# Patient Record
Sex: Female | Born: 1985 | Race: White | Hispanic: No | Marital: Single | Smoking: Current every day smoker
Health system: Southern US, Community
[De-identification: ages and names within clinical notes are randomized; demographics above are authoritative.]

## PROBLEM LIST (undated history)

## (undated) ENCOUNTER — Inpatient Hospital Stay (HOSPITAL_COMMUNITY): Payer: Self-pay

## (undated) DIAGNOSIS — M25311 Other instability, right shoulder: Secondary | ICD-10-CM

## (undated) DIAGNOSIS — J302 Other seasonal allergic rhinitis: Secondary | ICD-10-CM

## (undated) DIAGNOSIS — Z0000 Encounter for general adult medical examination without abnormal findings: Secondary | ICD-10-CM

## (undated) DIAGNOSIS — IMO0002 Reserved for concepts with insufficient information to code with codable children: Secondary | ICD-10-CM

## (undated) DIAGNOSIS — F419 Anxiety disorder, unspecified: Secondary | ICD-10-CM

## (undated) DIAGNOSIS — R51 Headache: Secondary | ICD-10-CM

## (undated) DIAGNOSIS — F410 Panic disorder [episodic paroxysmal anxiety] without agoraphobia: Secondary | ICD-10-CM

## (undated) HISTORY — PX: WISDOM TOOTH EXTRACTION: SHX21

## (undated) HISTORY — DX: Encounter for general adult medical examination without abnormal findings: Z00.00

---
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
## 2006-06-16 ENCOUNTER — Inpatient Hospital Stay (HOSPITAL_COMMUNITY): Admission: AD | Admit: 2006-06-16 | Discharge: 2006-06-17 | Payer: Self-pay | Admitting: Obstetrics & Gynecology

## 2006-06-30 ENCOUNTER — Encounter (INDEPENDENT_AMBULATORY_CARE_PROVIDER_SITE_OTHER): Payer: Self-pay | Admitting: Obstetrics & Gynecology

## 2006-06-30 ENCOUNTER — Ambulatory Visit: Payer: Self-pay | Admitting: Obstetrics & Gynecology

## 2006-08-10 ENCOUNTER — Ambulatory Visit: Payer: Self-pay | Admitting: Obstetrics & Gynecology

## 2006-08-24 ENCOUNTER — Ambulatory Visit: Payer: Self-pay | Admitting: Obstetrics & Gynecology

## 2009-03-25 ENCOUNTER — Emergency Department (HOSPITAL_COMMUNITY): Admission: EM | Admit: 2009-03-25 | Discharge: 2009-03-25 | Payer: Self-pay | Admitting: Emergency Medicine

## 2009-04-10 ENCOUNTER — Encounter: Admission: RE | Admit: 2009-04-10 | Discharge: 2009-04-10 | Payer: Self-pay | Admitting: Specialist

## 2009-08-26 ENCOUNTER — Encounter: Admission: RE | Admit: 2009-08-26 | Discharge: 2009-08-26 | Payer: Self-pay | Admitting: Nephrology

## 2010-10-13 ENCOUNTER — Inpatient Hospital Stay (HOSPITAL_COMMUNITY): Admission: AD | Admit: 2010-10-13 | Discharge: 2010-10-13 | Payer: Self-pay | Admitting: Obstetrics & Gynecology

## 2010-10-17 ENCOUNTER — Ambulatory Visit: Payer: Self-pay | Admitting: Interventional Radiology

## 2010-10-17 ENCOUNTER — Emergency Department (HOSPITAL_BASED_OUTPATIENT_CLINIC_OR_DEPARTMENT_OTHER): Admission: EM | Admit: 2010-10-17 | Discharge: 2010-10-17 | Payer: Self-pay | Admitting: Emergency Medicine

## 2010-10-28 ENCOUNTER — Emergency Department (HOSPITAL_COMMUNITY)
Admission: EM | Admit: 2010-10-28 | Discharge: 2010-10-29 | Disposition: A | Discharge status: Other Acute Inpatient Hospital | Payer: Self-pay | Source: Home / Self Care | Admitting: Emergency Medicine

## 2010-10-29 ENCOUNTER — Ambulatory Visit: Payer: Self-pay | Admitting: Psychiatry

## 2010-10-29 ENCOUNTER — Inpatient Hospital Stay (HOSPITAL_COMMUNITY)
Admission: RE | Admit: 2010-10-29 | Discharge: 2010-11-03 | Payer: Self-pay | Source: Home / Self Care | Admitting: Psychiatry

## 2011-02-09 LAB — CBC
Hemoglobin: 14.5 g/dL (ref 12.0–15.0)
MCH: 29.4 pg (ref 26.0–34.0)
MCV: 86.8 fL (ref 78.0–100.0)
RBC: 4.93 MIL/uL (ref 3.87–5.11)

## 2011-02-09 LAB — POCT PREGNANCY, URINE
Preg Test, Ur: NEGATIVE
Preg Test, Ur: NEGATIVE

## 2011-02-09 LAB — URINALYSIS, ROUTINE W REFLEX MICROSCOPIC
Glucose, UA: NEGATIVE mg/dL
Hgb urine dipstick: NEGATIVE
Ketones, ur: NEGATIVE mg/dL
Ketones, ur: NEGATIVE mg/dL
Leukocytes, UA: NEGATIVE
Protein, ur: NEGATIVE mg/dL
Urobilinogen, UA: 0.2 mg/dL (ref 0.0–1.0)
pH: 7 (ref 5.0–8.0)

## 2011-02-09 LAB — DIFFERENTIAL
Eosinophils Absolute: 0.2 10*3/uL (ref 0.0–0.7)
Eosinophils Relative: 2 % (ref 0–5)
Lymphs Abs: 2.7 10*3/uL (ref 0.7–4.0)
Monocytes Relative: 9 % (ref 3–12)
Neutrophils Relative %: 67 % (ref 43–77)

## 2011-02-09 LAB — HEPATIC FUNCTION PANEL
AST: 15 U/L (ref 0–37)
Albumin: 3.5 g/dL (ref 3.5–5.2)
Total Bilirubin: 0.3 mg/dL (ref 0.3–1.2)

## 2011-02-09 LAB — BASIC METABOLIC PANEL
CO2: 25 mEq/L (ref 19–32)
Chloride: 107 mEq/L (ref 96–112)
GFR calc Af Amer: >60 mL/min (ref >60)
Potassium: 3.7 mEq/L (ref 3.5–5.1)
Sodium: 139 mEq/L (ref 135–145)

## 2011-02-09 LAB — URINE MICROSCOPIC-ADD ON

## 2011-02-09 LAB — RAPID URINE DRUG SCREEN, HOSP PERFORMED
Amphetamines: NOT DETECTED
Benzodiazepines: POSITIVE — AB
Opiates: POSITIVE — AB
Tetrahydrocannabinol: NOT DETECTED

## 2011-02-09 LAB — WET PREP, GENITAL: Yeast Wet Prep HPF POC: NONE SEEN

## 2011-04-16 NOTE — Group Therapy Note (Signed)
[name], [name]             ACCOUNT NO.:  [id number]   MEDICAL RECORD NO.:  [id number]          PATIENT TYPE:  WOC   LOCATION:  [facility]                   FACILITY:  [facility]   PHYSICIAN:  Dorthula Perfect, MD     DATE OF BIRTH:  21-Oct-1986   DATE OF SERVICE:  08/24/2006                                    CLINIC NOTE   25 year old gravida 2, para 2 returns for follow up of condyloma and to  receive TCA.  She has been using Depo-Provera since the age of 36 for  contraception.  Her menstrual periods are irregular.  She has irregular  bleeding and spotting.  She was seen here 2 weeks ago for the second  treatment with TCA.  Her pregnancy test, at her request, is negative.   PHYSICAL EXAMINATION:  ABDOMEN:  Soft and nontender.  No masses are felt.  PELVIC:  Her bimanual examination is completely normal, with the exception  of kissing very small condyloma on the lower labial minora near the  fourchette area.  The remainder of the pelvic exam is normal.  The ovaries  are normal.   IMPRESSION:  1. Normal gynecological exam.  2. Condyloma, persistent.   DISPOSITION:  1. TCA 80% with cotton tip applicator.  2. She will return after waiting 2 to 4 weeks to see if this TCA finally      gets rid of the condyloma.  3. The patient is wondering about stopping the Depo-Provera.  I gave her      the pros and cons of low-dose birth control pills.  She is due for      another Depo-Provera in the later part of November.  She will decide by      then whether to continue the Depo-Provera or switch to birth control      pills.           ______________________________  Dorthula Perfect, MD     ER/MEDQ  D:  08/24/2006  T:  08/26/2006  Job:  [id number]

## 2011-04-16 NOTE — Group Therapy Note (Signed)
[name], [name]             ACCOUNT NO.:  [id number]   MEDICAL RECORD NO.:  [id number]          PATIENT TYPE:  WOC   LOCATION:  [facility]                   FACILITY:  [facility]   PHYSICIAN:  Dorthula Perfect, MD     DATE OF BIRTH:  05-19-1986   DATE OF SERVICE:                                    CLINIC NOTE   HISTORY OF PRESENT ILLNESS:  This 25 year old white female, gravida 2,  aborted 2, last menstrual period 05/18/2006 presents today for treatment of  her condyloma.  For the past two to three years, she has received Depo  Provera every three months for birth control and as a result, she has  periods every couple of months.  Her periods are normal.  She was seen in  the MAU a few weeks ago for evaluation of a vaginal discharge and was  diagnosed with bacterial vaginosis.  It was at that time she was told that  she had condyloma.   PAST MEDICAL HISTORY:  No operations.   MEDICATIONS:  No medications other than the Depo Provera.   ALLERGIES:  No allergies.   REVIEW OF SYSTEMS:  Normal.  Good health.  No cardiovascular, respiratory,  GI, or GU complaints.   PHYSICAL EXAMINATION:  The abdomen is soft and nontender.  No masses are  felt.  On pelvic examination, external genitalia BS glands are normal.  Vaginal vault was epithelialized, as was the cervix.  The uterus was in the  midline and normal size and shape.  The adnexal structures are normal.   The patient has a small condyloma kissing the lower labia bilaterally.   DIAGNOSES:  1.  Normal GYN exam.  2.  Condyloma.   DISPOSITION:  1.  Pap smear.  2.  Condyloma treated with 80% TCA.  The patient was instructed to take a      sitz bath rather than shower this evening.  She was told that if these      are still present a month from now, that she should return.           ______________________________  Dorthula Perfect, MD     ER/MEDQ  D:  06/30/2006  T:  06/30/2006  Job:  [id number]

## 2011-04-16 NOTE — Group Therapy Note (Signed)
[name], [name]             ACCOUNT NO.:  [id number]   MEDICAL RECORD NO.:  [id number]          PATIENT TYPE:  WOC   LOCATION:  [facility]                   FACILITY:  [facility]   PHYSICIAN:  Elsie Lincoln, MD      DATE OF BIRTH:  1986/07/25   DATE OF SERVICE:  08/10/2006                                    CLINIC NOTE   A 25 year old G2, A2, nonpregnant, who presents with a history of condyloma  labial bilaterally.  She was treated with TCA 80% on August 2.  A Pap on  that date was normal.  She was also treated for BV on August 2 with Flagyl.  She complains now of returning symptoms of both condyloma and yellowish  discharge x1 week with pain and itching.  Last menstrual period was June 20.  She has some occasional spotting but not regular periods and she is not  sexually active.   PHYSICAL EXAMINATION:  Revealed bilateral kissing condyloma on lower labia  minora.  No discharge. Wet prep and GC ECT were collected.  Occasional clue  cells were seen on the wet prep slide.   PLAN:  Retreat with TCA 80% on the affected areas, which was done with a  cotton-tipped applicator.  Clindesse cream was given for BV and the patient  was instructed to return to the clinic in 2 weeks for another treatment with  TCA.           ______________________________  Elsie Lincoln, MD     KL/MEDQ  D:  08/10/2006  T:  08/11/2006  Job:  [id number]

## 2011-10-29 ENCOUNTER — Inpatient Hospital Stay (HOSPITAL_COMMUNITY)
Admission: AD | Admit: 2011-10-29 | Discharge: 2011-10-29 | Disposition: A | Discharge status: Home / Self Care | Payer: Medicaid Other | Source: Ambulatory Visit | Attending: Family Medicine | Admitting: Family Medicine

## 2011-10-29 ENCOUNTER — Encounter (HOSPITAL_COMMUNITY): Payer: Self-pay

## 2011-10-29 DIAGNOSIS — R300 Dysuria: Secondary | ICD-10-CM | POA: Insufficient documentation

## 2011-10-29 DIAGNOSIS — O99891 Other specified diseases and conditions complicating pregnancy: Secondary | ICD-10-CM | POA: Insufficient documentation

## 2011-10-29 HISTORY — DX: Anxiety disorder, unspecified: F41.9

## 2011-10-29 LAB — WET PREP, GENITAL
Clue Cells Wet Prep HPF POC: NONE SEEN
Trich, Wet Prep: NONE SEEN
Yeast Wet Prep HPF POC: NONE SEEN

## 2011-10-29 LAB — URINALYSIS, ROUTINE W REFLEX MICROSCOPIC
Bilirubin Urine: NEGATIVE
Glucose, UA: NEGATIVE mg/dL
Ketones, ur: NEGATIVE mg/dL
Nitrite: NEGATIVE
pH: 7 (ref 5.0–8.0)

## 2011-10-29 LAB — URINE MICROSCOPIC-ADD ON

## 2011-10-29 MED ORDER — NITROFURANTOIN MONOHYD MACRO 100 MG PO CAPS: 100.0000 mg | ORAL_CAPSULE | Freq: Two times a day (BID) | ORAL | Status: AC

## 2011-10-29 NOTE — ED Provider Notes (Signed)
History     Chief Complaint  Patient presents with  . Urinary Tract Infection   HPI  Pt states noted burning with voiding last pm, hx kidney stones, and uti's in past. Denies bleeding or vaginal d/c changes. No reports of pelvic pain, fever, body aches, or chills.     Past Medical History  Diagnosis Date  . Depression   . Anxiety     Past Surgical History  Procedure Date  . Wisdom tooth extraction     History reviewed. No pertinent family history.  History  Substance Use Topics  . Smoking status: Former Games developer  . Smokeless tobacco: Never Used  . Alcohol Use: No    Allergies: No Known Allergies  Prescriptions prior to admission  Medication Sig Dispense Refill  . ALPRAZolam (XANAX) 1 MG tablet Take 1 mg by mouth 2 (two) times daily.        . cyclobenzaprine (FLEXERIL) 10 MG tablet Take 10 mg by mouth 3 (three) times daily as needed. Muscle spasms       . oxyCODONE-acetaminophen (PERCOCET) 5-325 MG per tablet Take 1 tablet by mouth every 6 (six) hours as needed. Pain        . prenatal vitamin w/FE, FA (PRENATAL 1 + 1) 27-1 MG TABS Take 1 tablet by mouth daily.          Review of Systems  Gastrointestinal: Negative for abdominal pain.  Genitourinary: Positive for dysuria and frequency. Negative for hematuria and flank pain.  Musculoskeletal: Back pain: lower.  All other systems reviewed and are negative.   Physical Exam   Blood pressure 121/74, pulse 104, temperature 98.5 F (36.9 C), temperature source Oral, resp. rate 16, height 5\' 4"  (1.626 m), weight 68.266 kg (150 lb 8 oz), last menstrual period 09/17/2011.  Physical Exam  Constitutional: She is oriented to person, place, and time. She appears well-developed and well-nourished. No distress.  HENT:  Head: Normocephalic.  Neck: Normal range of motion. Neck supple.  Cardiovascular: Normal rate, regular rhythm and normal heart sounds.   Respiratory: Effort normal and breath sounds normal.  GI: Soft. She  exhibits no mass. There is no tenderness. There is no rebound, no guarding and no CVA tenderness.  Genitourinary: Uterus is enlarged. Cervix exhibits no motion tenderness and no discharge. Right adnexum displays no mass and no tenderness. Left adnexum displays no mass and no tenderness.  Musculoskeletal: Normal range of motion.  Neurological: She is alert and oriented to person, place, and time.  Skin: Skin is warm and dry.  Psychiatric: She has a normal mood and affect.    MAU Course  Procedures  UA - neg leuk, trace hgb Wet prep - negative GC/CT - pend  Assessment and Plan  Dysuria  Plan: DC to home RX Macrobid 100 mg BID Begin prenatal care asap  [facility] 10/29/2011, 1:38 PM

## 2011-10-29 NOTE — ED Provider Notes (Signed)
Chart reviewed and agree with management and plan.  

## 2011-10-29 NOTE — Progress Notes (Signed)
Pt states noted burning with voiding last pm, hx kidney stones, and uti's in past. Denies bleeding or vaginal d/c changes. No pnc thus far.

## 2011-11-11 ENCOUNTER — Ambulatory Visit: Payer: BC Managed Care – PPO | Admitting: Gynecology

## 2011-11-11 ENCOUNTER — Encounter: Payer: Self-pay | Admitting: Gynecology

## 2011-11-11 DIAGNOSIS — Z3400 Encounter for supervision of normal first pregnancy, unspecified trimester: Secondary | ICD-10-CM

## 2011-11-11 DIAGNOSIS — O3680X0 Pregnancy with inconclusive fetal viability, not applicable or unspecified: Secondary | ICD-10-CM

## 2011-11-12 ENCOUNTER — Ambulatory Visit (HOSPITAL_COMMUNITY)
Admission: RE | Admit: 2011-11-12 | Discharge: 2011-11-12 | Disposition: A | Discharge status: Home / Self Care | Payer: Medicaid Other | Source: Ambulatory Visit | Attending: Family Medicine | Admitting: Family Medicine

## 2011-11-12 DIAGNOSIS — O3680X0 Pregnancy with inconclusive fetal viability, not applicable or unspecified: Secondary | ICD-10-CM

## 2011-11-12 DIAGNOSIS — Z3689 Encounter for other specified antenatal screening: Secondary | ICD-10-CM | POA: Insufficient documentation

## 2011-11-12 LAB — OBSTETRIC PANEL
Antibody Screen: NEGATIVE
Basophils Absolute: 0.1 10*3/uL (ref 0.0–0.1)
Eosinophils Absolute: 0.2 10*3/uL (ref 0.0–0.7)
Eosinophils Relative: 2 % (ref 0–5)
HCT: 41.9 % (ref 36.0–46.0)
MCH: 28.9 pg (ref 26.0–34.0)
MCHC: 32.9 g/dL (ref 30.0–36.0)
MCV: 87.8 fL (ref 78.0–100.0)
Monocytes Absolute: 0.7 10*3/uL (ref 0.1–1.0)
Platelets: 341 10*3/uL (ref 150–400)
RDW: 13.5 % (ref 11.5–15.5)

## 2011-11-12 LAB — HIV ANTIBODY (ROUTINE TESTING W REFLEX): HIV: NONREACTIVE

## 2011-11-12 LAB — HEPATITIS C ANTIBODY: HCV Ab: NEGATIVE

## 2011-11-13 LAB — CULTURE, URINE COMPREHENSIVE: Organism ID, Bacteria: NO GROWTH

## 2011-11-30 ENCOUNTER — Encounter (HOSPITAL_COMMUNITY): Payer: Self-pay | Admitting: *Deleted

## 2011-11-30 ENCOUNTER — Inpatient Hospital Stay (HOSPITAL_COMMUNITY)
Admission: AD | Admit: 2011-11-30 | Discharge: 2011-11-30 | Disposition: A | Discharge status: Home / Self Care | Payer: Medicaid Other | Source: Ambulatory Visit | Attending: Obstetrics & Gynecology | Admitting: Obstetrics & Gynecology

## 2011-11-30 DIAGNOSIS — O209 Hemorrhage in early pregnancy, unspecified: Secondary | ICD-10-CM | POA: Insufficient documentation

## 2011-11-30 LAB — URINALYSIS, ROUTINE W REFLEX MICROSCOPIC
Bilirubin Urine: NEGATIVE
Glucose, UA: NEGATIVE mg/dL
Ketones, ur: 15 mg/dL — AB
Leukocytes, UA: NEGATIVE
Protein, ur: NEGATIVE mg/dL
pH: 6 (ref 5.0–8.0)

## 2011-11-30 LAB — RAPID URINE DRUG SCREEN, HOSP PERFORMED
Barbiturates: NOT DETECTED
Benzodiazepines: POSITIVE — AB

## 2011-11-30 MED ORDER — RHO D IMMUNE GLOBULIN 1500 UNIT/2ML IJ SOLN
300.0000 ug | Freq: Once | INTRAMUSCULAR | Status: AC
  Administered 2011-11-30: 300 ug via INTRAMUSCULAR
  Filled 2011-11-30: qty 2

## 2011-11-30 NOTE — Progress Notes (Signed)
Pt presents to [facility] with c/o spotting that started yesterday.  Not wearing a pad.  Has been worse today than yesterday.

## 2011-11-30 NOTE — L&D Delivery Note (Signed)
Delivery Note At 9:01 PM a viable and healthy female was delivered via  (Presentation: LOA ).  APGAR: 2 ,7 ; weight pending.   Placenta status:intact,spontaneous .  Cord: 3 vessel with the following complications: nuchal x1 loose.  Cord clamped and cut at delivery and infant to warmer. Cord pH: 7.332.  Anesthesia:  epidural Episiotomy: None Lacerations: None Suture Repair: N/A Est. Blood Loss (mL): 250 ml  Mom to postpartum.  Baby to nursery-stable.  LEFTWICH-KIRBY, [name] 07/01/2012, 9:23 PM

## 2011-11-30 NOTE — Progress Notes (Signed)
Patient states she started having abdominal cramping( off and on) and brown spotting (of and on) last night. Slight cramping at this time.

## 2011-11-30 NOTE — ED Provider Notes (Signed)
[name] is a 26 y.o. female @ [redacted]w[redacted]d gestation who presents to MAU for spotting that she first noticed yesterday. Last sexual intercourse was 3 weeks ago. Is concerned because she has had a previous miscarriage. Was evaluated in MAU 10/29/11 for vaginal discharge in pregnancy and treated for BV. She started her prenatal care at [facility] and had an ultrasound on 11/12/11 that showed an 8 week viable IUP with a normal appearance. On exam today there is scant brown vaginal discharge. Cervix is long and closed.   Informal bedside ultrasound shows IUP with cardiac activity. Patient able to visualize heart beat.   Previous lab shows that patient is Rh negative. Will do Rh workup and Rhogam as indicated.  Discussed findings with Dr. Marice Potter. Since the patient has bleeding and a history of cocaine abuse we will order UDS and if positive will discuss affects of cocaine on pregnancy.   Patient has appointment in the office 12/02/11 she will plan to keep the appointment for follow up.  Assessment:  Bleeding in pregnancy  Plan:   Keep follow up office appointment    Care turned over to [facility]    UDS and Rh workup pending   Kerrie Buffalo, NP 11/30/11 2000  I accepted care of this pt from [facility], FNP. Pt has now received rhophylac injection. Drug screen is still pending as it is a "send out." Results of test will be discussed at her next appt in the office on 12/02/11.  Discussed diet, activity, risks, and precautions. Pt is to avoid intercourse for 1wk.  Clinton Gallant. Naysha Sholl III, DrHSc, MPAS, PA-C   Henrietta Hoover, [place] 11/30/11 2108

## 2011-11-30 NOTE — Progress Notes (Signed)
Mayer Camel, NP at bedside.  Assessment done and poc discussed with pt.  Bedside US done per NP.

## 2011-12-01 LAB — RH IG WORKUP (INCLUDES ABO/RH)
Antibody Screen: NEGATIVE
Unit division: 0

## 2011-12-02 ENCOUNTER — Other Ambulatory Visit (HOSPITAL_COMMUNITY)
Admission: RE | Admit: 2011-12-02 | Discharge: 2011-12-02 | Disposition: A | Discharge status: Home / Self Care | Payer: Medicaid Other | Source: Ambulatory Visit | Attending: Obstetrics & Gynecology | Admitting: Obstetrics & Gynecology

## 2011-12-02 ENCOUNTER — Ambulatory Visit (INDEPENDENT_AMBULATORY_CARE_PROVIDER_SITE_OTHER): Payer: Medicaid Other | Admitting: Obstetrics & Gynecology

## 2011-12-02 ENCOUNTER — Encounter: Payer: Self-pay | Admitting: Obstetrics & Gynecology

## 2011-12-02 VITALS — BP 119/76 | Wt 158.0 lb

## 2011-12-02 DIAGNOSIS — Z113 Encounter for screening for infections with a predominantly sexual mode of transmission: Secondary | ICD-10-CM | POA: Insufficient documentation

## 2011-12-02 DIAGNOSIS — Z01419 Encounter for gynecological examination (general) (routine) without abnormal findings: Secondary | ICD-10-CM | POA: Insufficient documentation

## 2011-12-02 DIAGNOSIS — Z3400 Encounter for supervision of normal first pregnancy, unspecified trimester: Secondary | ICD-10-CM

## 2011-12-02 NOTE — Progress Notes (Signed)
Addended by: Barbara Cower on: 12/02/2011 03:04 PM   Modules accepted: Orders

## 2011-12-02 NOTE — Progress Notes (Signed)
She is here for a NOB visit. She was seen at [facility] for a small amount of vag bleeding 2 days ago. Her ultrasound was normal and she was given Rhophylac and prescribed pelvic rest for a week. Her bleeding has resolved. We discussed recommended weight gain of less than 30 pounds. She would like 1st trimester screen with MFM. I have counseled her about baby addiction to benzos if she uses them regularly. She says she only takes them when she has a panic attack/PTSD. Her entire physical exam is normal.

## 2011-12-06 ENCOUNTER — Other Ambulatory Visit: Payer: Self-pay | Admitting: Obstetrics & Gynecology

## 2011-12-06 DIAGNOSIS — Z3682 Encounter for antenatal screening for nuchal translucency: Secondary | ICD-10-CM

## 2011-12-17 ENCOUNTER — Ambulatory Visit (HOSPITAL_COMMUNITY)
Admission: RE | Admit: 2011-12-17 | Discharge: 2011-12-17 | Disposition: A | Discharge status: Home / Self Care | Payer: Medicaid Other | Source: Ambulatory Visit | Attending: Obstetrics & Gynecology | Admitting: Obstetrics & Gynecology

## 2011-12-17 ENCOUNTER — Other Ambulatory Visit: Payer: Self-pay

## 2011-12-17 DIAGNOSIS — Z3682 Encounter for antenatal screening for nuchal translucency: Secondary | ICD-10-CM

## 2011-12-17 DIAGNOSIS — O351XX0 Maternal care for (suspected) chromosomal abnormality in fetus, not applicable or unspecified: Secondary | ICD-10-CM | POA: Insufficient documentation

## 2011-12-17 DIAGNOSIS — O3510X0 Maternal care for (suspected) chromosomal abnormality in fetus, unspecified, not applicable or unspecified: Secondary | ICD-10-CM | POA: Insufficient documentation

## 2011-12-17 DIAGNOSIS — Z3689 Encounter for other specified antenatal screening: Secondary | ICD-10-CM | POA: Insufficient documentation

## 2011-12-21 ENCOUNTER — Encounter: Payer: Self-pay | Admitting: Obstetrics & Gynecology

## 2011-12-21 DIAGNOSIS — Z3490 Encounter for supervision of normal pregnancy, unspecified, unspecified trimester: Secondary | ICD-10-CM | POA: Insufficient documentation

## 2011-12-23 ENCOUNTER — Other Ambulatory Visit: Payer: Self-pay | Admitting: Obstetrics & Gynecology

## 2011-12-23 ENCOUNTER — Telehealth (HOSPITAL_COMMUNITY): Payer: Self-pay | Admitting: MS"

## 2011-12-23 DIAGNOSIS — O289 Unspecified abnormal findings on antenatal screening of mother: Secondary | ICD-10-CM

## 2011-12-23 DIAGNOSIS — Z3689 Encounter for other specified antenatal screening: Secondary | ICD-10-CM

## 2011-12-23 NOTE — Telephone Encounter (Signed)
Called Ms. Engineer, [name] to discuss First Trimester Screen result, which indicated an increased risk for trisomy 18/13 (1:68) in the current pregnancy. Additionally, this screen was within normal range for Down syndrome (less than 1 in 10,000). We reviewed the option of second trimester targeted ultrasound as well as genetic counseling to discuss additional screening and testing options available to her in the pregnancy. I offered Ms. Asaro a sooner genetic counseling visit to further discuss the First trimester screen result and additional available tests. Ms. Khamis stated that she understands that screening tests are not diagnostic and that she currently feels comfortable with a 1 in 68 risk. She elected to proceed with genetic counseling at the time of her second trimester detailed ultrasound. I encouraged Ms. Spraker to call back with additional questions or if she would like a sooner genetic counseling visit.   Quinn Plowman, MS Patent attorney

## 2011-12-29 ENCOUNTER — Ambulatory Visit (INDEPENDENT_AMBULATORY_CARE_PROVIDER_SITE_OTHER): Payer: Medicaid Other | Admitting: Family Medicine

## 2011-12-29 DIAGNOSIS — Z3480 Encounter for supervision of other normal pregnancy, unspecified trimester: Secondary | ICD-10-CM

## 2011-12-29 DIAGNOSIS — Z6791 Unspecified blood type, Rh negative: Secondary | ICD-10-CM | POA: Insufficient documentation

## 2011-12-29 DIAGNOSIS — O360990 Maternal care for other rhesus isoimmunization, unspecified trimester, not applicable or unspecified: Secondary | ICD-10-CM

## 2011-12-29 DIAGNOSIS — O26899 Other specified pregnancy related conditions, unspecified trimester: Secondary | ICD-10-CM

## 2011-12-29 DIAGNOSIS — Z3490 Encounter for supervision of normal pregnancy, unspecified, unspecified trimester: Secondary | ICD-10-CM

## 2011-12-29 NOTE — Patient Instructions (Signed)
Pregnancy - Second Trimester The second trimester of pregnancy (3 to 6 months) is a period of rapid growth for you and your baby. At the end of the sixth month, your baby is about 9 inches long and weighs 1 1/2 pounds. You will begin to feel the baby move between 18 and 20 weeks of the pregnancy. This is called quickening. Weight gain is faster. A clear fluid (colostrum) may leak out of your breasts. You may feel small contractions of the womb (uterus). This is known as false labor or Braxton-Hicks contractions. This is like a practice for labor when the baby is ready to be born. Usually, the problems with morning sickness have usually passed by the end of your first trimester. Some women develop small dark blotches (called cholasma, mask of pregnancy) on their face that usually goes away after the baby is born. Exposure to the sun makes the blotches worse. Acne may also develop in some pregnant women and pregnant women who have acne, may find that it goes away. PRENATAL EXAMS  Blood work may continue to be done during prenatal exams. These tests are done to check on your health and the probable health of your baby. Blood work is used to follow your blood levels (hemoglobin). Anemia (low hemoglobin) is common during pregnancy. Iron and vitamins are given to help prevent this. You will also be checked for diabetes between 24 and 28 weeks of the pregnancy. Some of the previous blood tests may be repeated.   The size of the uterus is measured during each visit. This is to make sure that the baby is continuing to grow properly according to the dates of the pregnancy.   Your blood pressure is checked every prenatal visit. This is to make sure you are not getting toxemia.   Your urine is checked to make sure you do not have an infection, diabetes or protein in the urine.   Your weight is checked often to make sure gains are happening at the suggested rate. This is to ensure that both you and your baby are  growing normally.   Sometimes, an ultrasound is performed to confirm the proper growth and development of the baby. This is a test which bounces harmless sound waves off the baby so your caregiver can more accurately determine due dates.  Sometimes, a specialized test is done on the amniotic fluid surrounding the baby. This test is called an amniocentesis. The amniotic fluid is obtained by sticking a needle into the belly (abdomen). This is done to check the chromosomes in instances where there is a concern about possible genetic problems with the baby. It is also sometimes done near the end of pregnancy if an early delivery is required. In this case, it is done to help make sure the baby's lungs are mature enough for the baby to live outside of the womb. CHANGES OCCURING IN THE SECOND TRIMESTER OF PREGNANCY Your body goes through many changes during pregnancy. They vary from person to person. Talk to your caregiver about changes you notice that you are concerned about.  During the second trimester, you will likely have an increase in your appetite. It is normal to have cravings for certain foods. This varies from person to person and pregnancy to pregnancy.   Your lower abdomen will begin to bulge.   You may have to urinate more often because the uterus and baby are pressing on your bladder. It is also common to get more bladder infections during pregnancy (  pain with urination). You can help this by drinking lots of fluids and emptying your bladder before and after intercourse.   You may begin to get stretch marks on your hips, abdomen, and breasts. These are normal changes in the body during pregnancy. There are no exercises or medications to take that prevent this change.   You may begin to develop swollen and bulging veins (varicose veins) in your legs. Wearing support hose, elevating your feet for 15 minutes, 3 to 4 times a day and limiting salt in your diet helps lessen the problem.    Heartburn may develop as the uterus grows and pushes up against the stomach. Antacids recommended by your caregiver helps with this problem. Also, eating smaller meals 4 to 5 times a day helps.   Constipation can be treated with a stool softener or adding bulk to your diet. Drinking lots of fluids, vegetables, fruits, and whole grains are helpful.   Exercising is also helpful. If you have been very active up until your pregnancy, most of these activities can be continued during your pregnancy. If you have been less active, it is helpful to start an exercise program such as walking.   Hemorrhoids (varicose veins in the rectum) may develop at the end of the second trimester. Warm sitz baths and hemorrhoid cream recommended by your caregiver helps hemorrhoid problems.   Backaches may develop during this time of your pregnancy. Avoid heavy lifting, wear low heal shoes and practice good posture to help with backache problems.   Some pregnant women develop tingling and numbness of their hand and fingers because of swelling and tightening of ligaments in the wrist (carpel tunnel syndrome). This goes away after the baby is born.   As your breasts enlarge, you may have to get a bigger bra. Get a comfortable, cotton, support bra. Do not get a nursing bra until the last month of the pregnancy if you will be nursing the baby.   You may get a dark line from your belly button to the pubic area called the linea nigra.   You may develop rosy cheeks because of increase blood flow to the face.   You may develop spider looking lines of the face, neck, arms and chest. These go away after the baby is born.  HOME CARE INSTRUCTIONS   It is extremely important to avoid all smoking, herbs, alcohol, and unprescribed drugs during your pregnancy. These chemicals affect the formation and growth of the baby. Avoid these chemicals throughout the pregnancy to ensure the delivery of a healthy infant.   Most of your home  care instructions are the same as suggested for the first trimester of your pregnancy. Keep your caregiver's appointments. Follow your caregiver's instructions regarding medication use, exercise and diet.   During pregnancy, you are providing food for you and your baby. Continue to eat regular, well-balanced meals. Choose foods such as meat, fish, milk and other low fat dairy products, vegetables, fruits, and whole-grain breads and cereals. Your caregiver will tell you of the ideal weight gain.   A physical sexual relationship may be continued up until near the end of pregnancy if there are no other problems. Problems could include early (premature) leaking of amniotic fluid from the membranes, vaginal bleeding, abdominal pain, or other medical or pregnancy problems.   Exercise regularly if there are no restrictions. Check with your caregiver if you are unsure of the safety of some of your exercises. The greatest weight gain will occur in the   last 2 trimesters of pregnancy. Exercise will help you:   Control your weight.   Get you in shape for labor and delivery.   Lose weight after you have the baby.   Wear a good support or jogging bra for breast tenderness during pregnancy. This may help if worn during sleep. Pads or tissues may be used in the bra if you are leaking colostrum.   Do not use hot tubs, steam rooms or saunas throughout the pregnancy.   Wear your seat belt at all times when driving. This protects you and your baby if you are in an accident.   Avoid raw meat, uncooked cheese, cat litter boxes and soil used by cats. These carry germs that can cause birth defects in the baby.   The second trimester is also a good time to visit your dentist for your dental health if this has not been done yet. Getting your teeth cleaned is OK. Use a soft toothbrush. Brush gently during pregnancy.   It is easier to loose urine during pregnancy. Tightening up and strengthening the pelvic muscles will  help with this problem. Practice stopping your urination while you are going to the bathroom. These are the same muscles you need to strengthen. It is also the muscles you would use as if you were trying to stop from passing gas. You can practice tightening these muscles up 10 times a set and repeating this about 3 times per day. Once you know what muscles to tighten up, do not perform these exercises during urination. It is more likely to contribute to an infection by backing up the urine.   Ask for help if you have financial, counseling or nutritional needs during pregnancy. Your caregiver will be able to offer counseling for these needs as well as refer you for other special needs.   Your skin may become oily. If so, wash your face with mild soap, use non-greasy moisturizer and oil or cream based makeup.  MEDICATIONS AND DRUG USE IN PREGNANCY  Take prenatal vitamins as directed. The vitamin should contain 1 milligram of folic acid. Keep all vitamins out of reach of children. Only a couple vitamins or tablets containing iron may be fatal to a baby or young child when ingested.   Avoid use of all medications, including herbs, over-the-counter medications, not prescribed or suggested by your caregiver. Only take over-the-counter or prescription medicines for pain, discomfort, or fever as directed by your caregiver. Do not use aspirin.   Let your caregiver also know about herbs you may be using.   Alcohol is related to a number of birth defects. This includes fetal alcohol syndrome. All alcohol, in any form, should be avoided completely. Smoking will cause low birth rate and premature babies.   Street or illegal drugs are very harmful to the baby. They are absolutely forbidden. A baby born to an addicted mother will be addicted at birth. The baby will go through the same withdrawal an adult does.  SEEK MEDICAL CARE IF:  You have any concerns or worries during your pregnancy. It is better to call with  your questions if you feel they cannot wait, rather than worry about them. SEEK IMMEDIATE MEDICAL CARE IF:   An unexplained oral temperature above 102 F (38.9 C) develops, or as your caregiver suggests.   You have leaking of fluid from the vagina (birth canal). If leaking membranes are suspected, take your temperature and tell your caregiver of this when you call.   There   is vaginal spotting, bleeding, or passing clots. Tell your caregiver of the amount and how many pads are used. Light spotting in pregnancy is common, especially following intercourse.   You develop a bad smelling vaginal discharge with a change in the color from clear to white.   You continue to feel sick to your stomach (nauseated) and have no relief from remedies suggested. You vomit blood or coffee ground-like materials.   You lose more than 2 pounds of weight or gain more than 2 pounds of weight over 1 week, or as suggested by your caregiver.   You notice swelling of your face, hands, feet, or legs.   You get exposed to German measles and have never had them.   You are exposed to fifth disease or chickenpox.   You develop belly (abdominal) pain. Round ligament discomfort is a common non-cancerous (benign) cause of abdominal pain in pregnancy. Your caregiver still must evaluate you.   You develop a bad headache that does not go away.   You develop fever, diarrhea, pain with urination, or shortness of breath.   You develop visual problems, blurry, or double vision.   You fall or are in a car accident or any kind of trauma.   There is mental or physical violence at home.  Document Released: 11/09/2001 Document Revised: 07/28/2011 Document Reviewed: 05/14/2009 [employer] Patient Information 2012 [employer]. Breastfeeding BENEFITS OF BREASTFEEDING For the baby  The first milk (colostrum) helps the baby's digestive system function better.   There are antibodies from the mother in the milk that help the  baby fight off infections.   The baby has a lower incidence of asthma, allergies, and SIDS (sudden infant death syndrome).   The nutrients in breast milk are better than formulas for the baby and helps the baby's brain grow better.   Babies who breastfeed have less gas, colic, and constipation.  For the mother  Breastfeeding helps develop a very special bond between mother and baby.   It is more convenient, always available at the correct temperature and cheaper than formula feeding.   It burns calories in the mother and helps with losing weight that was gained during pregnancy.   It makes the uterus contract back down to normal size faster and slows bleeding following delivery.   Breastfeeding mothers have a lower risk of developing breast cancer.  NURSE FREQUENTLY  A healthy, full-term baby may breastfeed as often as every hour or space his or her feedings to every 3 hours.   How often to nurse will vary from baby to baby. Watch your baby for signs of hunger, not the clock.   Nurse as often as the baby requests, or when you feel the need to reduce the fullness of your breasts.   Awaken the baby if it has been 3 to 4 hours since the last feeding.   Frequent feeding will help the mother make more milk and will prevent problems like sore nipples and engorgement of the breasts.  BABY'S POSITION AT THE BREAST  Whether lying down or sitting, be sure that the baby's tummy is facing your tummy.   Support the breast with 4 fingers underneath the breast and the thumb above. Make sure your fingers are well away from the nipple and baby's mouth.   Stroke the baby's lips and cheek closest to the breast gently with your finger or nipple.   When the baby's mouth is open wide enough, place all of your nipple and as much   of the dark area around the nipple as possible into your baby's mouth.   Pull the baby in close so the tip of the nose and the baby's cheeks touch the breast during the  feeding.  FEEDINGS  The length of each feeding varies from baby to baby and from feeding to feeding.   The baby must suck about 2 to 3 minutes for your milk to get to him or her. This is called a "let down." For this reason, allow the baby to feed on each breast as long as he or she wants. Your baby will end the feeding when he or she has received the right balance of nutrients.   To break the suction, put your finger into the corner of the baby's mouth and slide it between his or her gums before removing your breast from his or her mouth. This will help prevent sore nipples.  REDUCING BREAST ENGORGEMENT  In the first week after your baby is born, you may experience signs of breast engorgement. When breasts are engorged, they feel heavy, warm, full, and may be tender to the touch. You can reduce engorgement if you:   Nurse frequently, every 2 to 3 hours. Mothers who breastfeed early and often have fewer problems with engorgement.   Place light ice packs on your breasts between feedings. This reduces swelling. Wrap the ice packs in a lightweight towel to protect your skin.   Apply moist hot packs to your breast for 5 to 10 minutes before each feeding. This increases circulation and helps the milk flow.   Gently massage your breast before and during the feeding.   Make sure that the baby empties at least one breast at every feeding before switching sides.   Use a breast pump to empty the breasts if your baby is sleepy or not nursing well. You may also want to pump if you are returning to work or or you feel you are getting engorged.   Avoid bottle feeds, pacifiers or supplemental feedings of water or juice in place of breastfeeding.   Be sure the baby is latched on and positioned properly while breastfeeding.   Prevent fatigue, stress, and anemia.   Wear a supportive bra, avoiding underwire styles.   Eat a balanced diet with enough fluids.  If you follow these suggestions, your  engorgement should improve in 24 to 48 hours. If you are still experiencing difficulty, call your lactation consultant or caregiver. IS MY BABY GETTING ENOUGH MILK? Sometimes, mothers worry about whether their babies are getting enough milk. You can be assured that your baby is getting enough milk if:  The baby is actively sucking and you hear swallowing.   The baby nurses at least 8 to 12 times in a 24 hour time period. Nurse your baby until he or she unlatches or falls asleep at the first breast (at least 10 to 20 minutes), then offer the second side.   The baby is wetting 5 to 6 disposable diapers (6 to 8 cloth diapers) in a 24 hour period by 5 to 6 days of age.   The baby is having at least 2 to 3 stools every 24 hours for the first few months. Breast milk is all the food your baby needs. It is not necessary for your baby to have water or formula. In fact, to help your breasts make more milk, it is best not to give your baby supplemental feedings during the early weeks.   The stool should   be soft and yellow.   The baby should gain 4 to 7 ounces per week after he is 4 days old.  TAKE CARE OF YOURSELF Take care of your breasts by:  Bathing or showering daily.   Avoiding the use of soaps on your nipples.   Start feedings on your left breast at one feeding and on your right breast at the next feeding.   You will notice an increase in your milk supply 2 to 5 days after delivery. You may feel some discomfort from engorgement, which makes your breasts very firm and often tender. Engorgement "peaks" out within 24 to 48 hours. In the meantime, apply warm moist towels to your breasts for 5 to 10 minutes before feeding. Gentle massage and expression of some milk before feeding will soften your breasts, making it easier for your baby to latch on. Wear a well fitting nursing bra and air dry your nipples for 10 to 15 minutes after each feeding.   Only use cotton bra pads.   Only use pure lanolin on  your nipples after nursing. You do not need to wash it off before nursing.  Take care of yourself by:   Eating well-balanced meals and nutritious snacks.   Drinking milk, fruit juice, and water to satisfy your thirst (about 8 glasses a day).   Getting plenty of rest.   Increasing calcium in your diet (1200 mg a day).   Avoiding foods that you notice affect the baby in a bad way.  SEEK MEDICAL CARE IF:   You have any questions or difficulty with breastfeeding.   You need help.   You have a hard, red, sore area on your breast, accompanied by a fever of 100.5 F (38.1 C) or more.   Your baby is too sleepy to eat well or is having trouble sleeping.   Your baby is wetting less than 6 diapers per day, by 5 days of age.   Your baby's skin or white part of his or her eyes is more yellow than it was in the hospital.   You feel depressed.  Document Released: 11/15/2005 Document Revised: 07/28/2011 Document Reviewed: 06/30/2009 [employer] Patient Information 2012 [employer]. 

## 2011-12-29 NOTE — Progress Notes (Signed)
Discussed First trim. Screen result. Has anatomy and genetics scheduled.

## 2012-01-18 ENCOUNTER — Encounter (HOSPITAL_COMMUNITY): Payer: Self-pay | Admitting: Maternal and Fetal Medicine

## 2012-01-25 ENCOUNTER — Ambulatory Visit (HOSPITAL_COMMUNITY)
Admission: RE | Admit: 2012-01-25 | Discharge: 2012-01-25 | Disposition: A | Discharge status: Home / Self Care | Payer: Medicaid Other | Source: Ambulatory Visit | Attending: Obstetrics & Gynecology | Admitting: Obstetrics & Gynecology

## 2012-01-25 ENCOUNTER — Other Ambulatory Visit: Payer: Self-pay

## 2012-01-25 DIAGNOSIS — Z363 Encounter for antenatal screening for malformations: Secondary | ICD-10-CM | POA: Insufficient documentation

## 2012-01-25 DIAGNOSIS — Z3689 Encounter for other specified antenatal screening: Secondary | ICD-10-CM

## 2012-01-25 DIAGNOSIS — O358XX0 Maternal care for other (suspected) fetal abnormality and damage, not applicable or unspecified: Secondary | ICD-10-CM | POA: Insufficient documentation

## 2012-01-25 DIAGNOSIS — Z1389 Encounter for screening for other disorder: Secondary | ICD-10-CM | POA: Insufficient documentation

## 2012-01-25 DIAGNOSIS — O289 Unspecified abnormal findings on antenatal screening of mother: Secondary | ICD-10-CM | POA: Insufficient documentation

## 2012-01-25 LAB — QUAD SCREEN FOR MFM

## 2012-01-25 NOTE — Progress Notes (Signed)
Genetic Counseling  High-Risk Gestation Note  Appointment Date:  01/25/2012 Referred By: [name]., MD Date of Birth:  Jan 08, 1986   Pregnancy History: G2P0010 Estimated Date of Delivery: 06/23/12 Estimated Gestational Age: [redacted]w[redacted]d Attending: Particia Nearing, MD   [name] was seen for genetic counseling because of an increased risk for fetal trisomy 18 and trisomy 13 based on First trimester screening. She was accompanied by the mother of the father of the pregnancy.  She was counseled regarding the First trimester screen result and the associated 1 in 50 risk for fetal trisomy 28 or trisomy 7.  We reviewed chromosomes, nondisjunction, and the features and poor prognosis of trisomy 60.  In addition, we reviewed the screen adjusted reduction in risk for Down syndrome (1 in 975 to < 1 in 10,000).  We also discussed other explanations for a screen positive result including: differences in maternal metabolism, and normal variation.  We reviewed other available screening and diagnostic options including detailed ultrasound and amniocentesis.  We discussed the risks, limitations, and benefits of each. We discussed another type of screening test, noninvasive prenatal testing (NIPT), which utilizes cell free fetal DNA found in the maternal circulation. This test is not diagnostic for chromosome conditions, but can provide information regarding the presence or absence of extra fetal DNA for chromosomes 13, 18 and 21. Thus, it would not identify or rule out all fetal aneuploidy. The reported detection rate is greater than 99% for Trisomy 21, greater than 97% for Trisomy 18, and is approximately 80% (8 out of 10) for Trisomy 13. The false positive rate is thought to be less than 0.1% for any of these conditions.  After thoughtful consideration of these options, [name] elected to have ultrasound and cell free fetal DNA testing ([facility]), but declined amniocentesis.  [name] stated that she may  consider amniocentesis pending the results of cell free fetal DNA testing. She understands that ultrasound and [facility] cannot rule out all birth defects or genetic syndromes.  The patient was advised of this limitation and states she still does not want diagnostic testing at this time.   However, she was counseled that up to 90% of fetuses with trisomy 18, when well visualized, have detectable anomalies or soft markers by ultrasound.  A complete ultrasound was performed today.  The ultrasound report will be sent under separate cover.   [name] was provided with written information regarding cystic fibrosis (CF) including the carrier frequency and incidence in the Caucasian and Asian populations, the availability of carrier testing and prenatal diagnosis if indicated.  In addition, we discussed that CF is routinely screened for as part of the [name] newborn screening panel.  She declined testing today.   Both family histories were reviewed and found to be contributory for congenital deafness in the patient's maternal second cousin. He is currently 38 years old, had cochlear implant, and reportedly is healthy. An underlying cause is not known for his congenital deafness. Hearing loss can have many causes including genetic factors, environmental factors or a combination of both.  Sometimes hearing loss can occur as one feature of an underlying genetic condition or may be caused by a single nonworking gene. Additional information regarding a cause for the hearing loss in the family is needed in order to most accurately assess the chance for relatives, though given the reported family history and degree of relation, recurrence risk to the current pregnancy is likely low.   The patient also reported that her maternal  grandfather, age 13 years, has a form of ataxia, described to be due to his cerebellum shrinking. The patient reported that the underlying etiology is not know. However, her grandfather and his 7 sisters  are reportedly being enrolled in a research study via his neurologist. We discussed that the patient may contact us if additional information is obtained regarding an underlying cause in order to assess recurrence risk for relatives. Without further information regarding the provided family history, an accurate genetic risk cannot be calculated. Further genetic counseling is warranted if more information is obtained.  [name] denied exposure to environmental toxins or chemical agents. She denied the use of alcohol, tobacco or street drugs. She denied significant viral illnesses during the course of her pregnancy. Her medical and surgical histories were noncontributory.   I counseled [name], [name] for approximately 35 minutes regarding the above risks and available options.     Beverly Plowman, MS,  Certified [employer]  01/25/2012

## 2012-01-26 ENCOUNTER — Ambulatory Visit (INDEPENDENT_AMBULATORY_CARE_PROVIDER_SITE_OTHER): Payer: Medicaid Other | Admitting: Obstetrics and Gynecology

## 2012-01-26 DIAGNOSIS — O360990 Maternal care for other rhesus isoimmunization, unspecified trimester, not applicable or unspecified: Secondary | ICD-10-CM

## 2012-01-26 DIAGNOSIS — O26899 Other specified pregnancy related conditions, unspecified trimester: Secondary | ICD-10-CM

## 2012-01-26 DIAGNOSIS — Z3480 Encounter for supervision of other normal pregnancy, unspecified trimester: Secondary | ICD-10-CM

## 2012-01-26 DIAGNOSIS — Z6791 Unspecified blood type, Rh negative: Secondary | ICD-10-CM

## 2012-01-26 DIAGNOSIS — Z3490 Encounter for supervision of normal pregnancy, unspecified, unspecified trimester: Secondary | ICD-10-CM

## 2012-01-26 NOTE — Progress Notes (Signed)
Patient doing well without complaints. Patient reports having a CBG of 54 while eating her third slice of pizza. Patient reports long standing h/o hypoglycemic episode. Patient was provided with a meter but never had follow-up with endocrinologist. CBG today 67. Will provide endocrinology ref.

## 2012-01-28 ENCOUNTER — Telehealth: Payer: Self-pay | Admitting: *Deleted

## 2012-01-28 DIAGNOSIS — M62838 Other muscle spasm: Secondary | ICD-10-CM

## 2012-01-28 MED ORDER — CYCLOBENZAPRINE HCL 10 MG PO TABS: 10.0000 mg | ORAL_TABLET | Freq: Three times a day (TID) | ORAL | Status: DC | PRN

## 2012-01-28 NOTE — Telephone Encounter (Signed)
Patients flexeril did not make it to the pharmacy so I recalled it in for her.

## 2012-02-04 ENCOUNTER — Telehealth (HOSPITAL_COMMUNITY): Payer: Self-pay | Admitting: MS"

## 2012-02-04 NOTE — Telephone Encounter (Signed)
Called [employer] to discuss her Harmony, cell free fetal DNA testing.  We reviewed that these are within normal limits, showing a less than 1 in 10,000 risk for trisomies 21, 18 and 13.  We reviewed that this testing identifies > 99% of pregnancies with trisomy 21, >97% of pregnancies with trisomy 42, and >80% with trisomy 104; the false positive rate is <0.1% for all conditions.  She understands that this testing does not identify all genetic conditions.  All questions were answered to her satisfaction, she was encouraged to call with additional questions or concerns.  Quinn Plowman, MS Patent attorney

## 2012-02-24 ENCOUNTER — Ambulatory Visit (INDEPENDENT_AMBULATORY_CARE_PROVIDER_SITE_OTHER): Payer: Medicaid Other | Admitting: Obstetrics & Gynecology

## 2012-02-24 VITALS — BP 140/76 | Wt 177.0 lb

## 2012-02-24 DIAGNOSIS — Z3490 Encounter for supervision of normal pregnancy, unspecified, unspecified trimester: Secondary | ICD-10-CM

## 2012-02-24 DIAGNOSIS — Z3400 Encounter for supervision of normal first pregnancy, unspecified trimester: Secondary | ICD-10-CM

## 2012-02-24 NOTE — Progress Notes (Signed)
Routine visit. No problems. No VB, ROM, or CTXs. Good FM. Harmony test negative. 1hour glucola and labs at [facility].

## 2012-02-24 NOTE — Progress Notes (Signed)
She denies HA, visual changes, RUQ pain.

## 2012-02-24 NOTE — Progress Notes (Signed)
Patient is here for routine prenatal check.  She never got her appointment for endocrinology.

## 2012-03-09 ENCOUNTER — Other Ambulatory Visit (INDEPENDENT_AMBULATORY_CARE_PROVIDER_SITE_OTHER): Payer: Medicaid Other | Admitting: *Deleted

## 2012-03-09 DIAGNOSIS — Z3400 Encounter for supervision of normal first pregnancy, unspecified trimester: Secondary | ICD-10-CM

## 2012-03-09 NOTE — Progress Notes (Signed)
Patient was here today for glucose tolerance test.  She is doing well.

## 2012-03-10 LAB — CBC WITH DIFFERENTIAL/PLATELET
Hemoglobin: 11.1 g/dL — ABNORMAL LOW (ref 12.0–15.0)
Lymphocytes Relative: 25 % (ref 12–46)
Lymphs Abs: 2.5 10*3/uL (ref 0.7–4.0)
Monocytes Relative: 5 % (ref 3–12)
Neutro Abs: 7 10*3/uL (ref 1.7–7.7)
Neutrophils Relative %: 70 % (ref 43–77)
Platelets: 352 10*3/uL (ref 150–400)
RBC: 3.93 MIL/uL (ref 3.87–5.11)
WBC: 10.1 10*3/uL (ref 4.0–10.5)

## 2012-03-10 LAB — HIV ANTIBODY (ROUTINE TESTING W REFLEX): HIV: NONREACTIVE

## 2012-03-10 LAB — RPR

## 2012-03-14 ENCOUNTER — Encounter: Payer: Self-pay | Admitting: Obstetrics & Gynecology

## 2012-03-21 ENCOUNTER — Encounter: Payer: Self-pay | Admitting: Obstetrics & Gynecology

## 2012-03-21 ENCOUNTER — Ambulatory Visit (INDEPENDENT_AMBULATORY_CARE_PROVIDER_SITE_OTHER): Payer: Medicaid Other | Admitting: Obstetrics & Gynecology

## 2012-03-21 DIAGNOSIS — Z3480 Encounter for supervision of other normal pregnancy, unspecified trimester: Secondary | ICD-10-CM

## 2012-03-21 NOTE — Progress Notes (Signed)
Routine visit. She has a tooth chipped and is having dental pain. I have rec'd a dentist ASAP. Good FM, no ROM, VB, CTXs. 1 hour glucola at 24 weeks was 134.  She has a meter at home and says they are mostly in the 60s fasting.

## 2012-04-04 ENCOUNTER — Ambulatory Visit (INDEPENDENT_AMBULATORY_CARE_PROVIDER_SITE_OTHER): Payer: Medicaid Other | Admitting: Obstetrics & Gynecology

## 2012-04-04 VITALS — BP 128/71 | Wt 183.0 lb

## 2012-04-04 DIAGNOSIS — Z3480 Encounter for supervision of other normal pregnancy, unspecified trimester: Secondary | ICD-10-CM

## 2012-04-04 DIAGNOSIS — IMO0002 Reserved for concepts with insufficient information to code with codable children: Secondary | ICD-10-CM

## 2012-04-04 DIAGNOSIS — O361190 Maternal care for Anti-A sensitization, unspecified trimester, not applicable or unspecified: Secondary | ICD-10-CM

## 2012-04-04 DIAGNOSIS — O26899 Other specified pregnancy related conditions, unspecified trimester: Secondary | ICD-10-CM

## 2012-04-04 DIAGNOSIS — Z3490 Encounter for supervision of normal pregnancy, unspecified, unspecified trimester: Secondary | ICD-10-CM

## 2012-04-04 DIAGNOSIS — Z20828 Contact with and (suspected) exposure to other viral communicable diseases: Secondary | ICD-10-CM

## 2012-04-04 DIAGNOSIS — O360990 Maternal care for other rhesus isoimmunization, unspecified trimester, not applicable or unspecified: Secondary | ICD-10-CM

## 2012-04-04 MED ORDER — PRENATAL PLUS 27-1 MG PO TABS: 1.0000 | ORAL_TABLET | Freq: Every day | ORAL | Status: DC

## 2012-04-04 MED ORDER — RHO D IMMUNE GLOBULIN 1500 UNIT/2ML IJ SOLN: 300.0000 ug | Freq: Once | INTRAMUSCULAR | Status: DC

## 2012-04-04 NOTE — Progress Notes (Signed)
Reported that her cousin had mononucleosis and CMV infection; had no symptoms of mononucleosis but wants to be tested for CMV.  CMV antibody panel (IgM and IgG ordered); EBV panel, CBC with differential and CMET also ordered.  No symptoms reported.   Moreover, there is little evidence of a teratogenic risk to the fetus in women who develop EBV infection during pregnancy and transplacental transmission of EBV appears to be rare. Will follow up labs and manage accordingly.  Rhogam given today.  No other complaints or concerns.  Fetal movement and labor precautions reviewed.

## 2012-04-04 NOTE — Patient Instructions (Signed)
Pregnancy - Third Trimester The third trimester of pregnancy (the last 3 months) is a period of the most rapid growth for you and your baby. The baby approaches a length of 20 inches and a weight of 6 to 10 pounds. The baby is adding on fat and getting ready for life outside your body. While inside, babies have periods of sleeping and waking, suck their thumbs, and hiccups. You can often feel small contractions of the uterus. This is false labor. It is also called Braxton-Hicks contractions. This is like a practice for labor. The usual problems in this stage of pregnancy include more difficulty breathing, swelling of the hands and feet from water retention, and having to urinate more often because of the uterus and baby pressing on your bladder.  PRENATAL EXAMS  Blood work may continue to be done during prenatal exams. These tests are done to check on your health and the probable health of your baby. Blood work is used to follow your blood levels (hemoglobin). Anemia (low hemoglobin) is common during pregnancy. Iron and vitamins are given to help prevent this. You may also continue to be checked for diabetes. Some of the past blood tests may be done again.   The size of the uterus is measured during each visit. This makes sure your baby is growing properly according to your pregnancy dates.   Your blood pressure is checked every prenatal visit. This is to make sure you are not getting toxemia.   Your urine is checked every prenatal visit for infection, diabetes and protein.   Your weight is checked at each visit. This is done to make sure gains are happening at the suggested rate and that you and your baby are growing normally.   Sometimes, an ultrasound is performed to confirm the position and the proper growth and development of the baby. This is a test done that bounces harmless sound waves off the baby so your caregiver can more accurately determine due dates.   Discuss the type of pain  medication and anesthesia you will have during your labor and delivery.   Discuss the possibility and anesthesia if a Cesarean Section might be necessary.   Inform your caregiver if there is any mental or physical violence at home.  Sometimes, a specialized non-stress test, contraction stress test and biophysical profile are done to make sure the baby is not having a problem. Checking the amniotic fluid surrounding the baby is called an amniocentesis. The amniotic fluid is removed by sticking a needle into the belly (abdomen). This is sometimes done near the end of pregnancy if an early delivery is required. In this case, it is done to help make sure the baby's lungs are mature enough for the baby to live outside of the womb. If the lungs are not mature and it is unsafe to deliver the baby, an injection of cortisone medication is given to the mother 1 to 2 days before the delivery. This helps the baby's lungs mature and makes it safer to deliver the baby. CHANGES OCCURING IN THE THIRD TRIMESTER OF PREGNANCY Your body goes through many changes during pregnancy. They vary from person to person. Talk to your caregiver about changes you notice and are concerned about.  During the last trimester, you have probably had an increase in your appetite. It is normal to have cravings for certain foods. This varies from person to person and pregnancy to pregnancy.   You may begin to get stretch marks on your hips,   abdomen, and breasts. These are normal changes in the body during pregnancy. There are no exercises or medications to take which prevent this change.   Constipation may be treated with a stool softener or adding bulk to your diet. Drinking lots of fluids, fiber in vegetables, fruits, and whole grains are helpful.   Exercising is also helpful. If you have been very active up until your pregnancy, most of these activities can be continued during your pregnancy. If you have been less active, it is helpful  to start an exercise program such as walking. Consult your caregiver before starting exercise programs.   Avoid all smoking, alcohol, un-prescribed drugs, herbs and "street drugs" during your pregnancy. These chemicals affect the formation and growth of the baby. Avoid chemicals throughout the pregnancy to ensure the delivery of a healthy infant.   Backache, varicose veins and hemorrhoids may develop or get worse.   You will tire more easily in the third trimester, which is normal.   The baby's movements may be stronger and more often.   You may become short of breath easily.   Your belly button may stick out.   A yellow discharge may leak from your breasts called colostrum.   You may have a bloody mucus discharge. This usually occurs a few days to a week before labor begins.  HOME CARE INSTRUCTIONS   Keep your caregiver's appointments. Follow your caregiver's instructions regarding medication use, exercise, and diet.   During pregnancy, you are providing food for you and your baby. Continue to eat regular, well-balanced meals. Choose foods such as meat, fish, milk and other low fat dairy products, vegetables, fruits, and whole-grain breads and cereals. Your caregiver will tell you of the ideal weight gain.   A physical sexual relationship may be continued throughout pregnancy if there are no other problems such as early (premature) leaking of amniotic fluid from the membranes, vaginal bleeding, or belly (abdominal) pain.   Exercise regularly if there are no restrictions. Check with your caregiver if you are unsure of the safety of your exercises. Greater weight gain will occur in the last 2 trimesters of pregnancy. Exercising helps:   Control your weight.   Get you in shape for labor and delivery.   You lose weight after you deliver.   Rest a lot with legs elevated, or as needed for leg cramps or low back pain.   Wear a good support or jogging bra for breast tenderness during  pregnancy. This may help if worn during sleep. Pads or tissues may be used in the bra if you are leaking colostrum.   Do not use hot tubs, steam rooms, or saunas.   Wear your seat belt when driving. This protects you and your baby if you are in an accident.   Avoid raw meat, cat litter boxes and soil used by cats. These carry germs that can cause birth defects in the baby.   It is easier to loose urine during pregnancy. Tightening up and strengthening the pelvic muscles will help with this problem. You can practice stopping your urination while you are going to the bathroom. These are the same muscles you need to strengthen. It is also the muscles you would use if you were trying to stop from passing gas. You can practice tightening these muscles up 10 times a set and repeating this about 3 times per day. Once you know what muscles to tighten up, do not perform these exercises during urination. It is more likely   to cause an infection by backing up the urine.   Ask for help if you have financial, counseling or nutritional needs during pregnancy. Your caregiver will be able to offer counseling for these needs as well as refer you for other special needs.   Make a list of emergency phone numbers and have them available.   Plan on getting help from family or friends when you go home from the hospital.   Make a trial run to the hospital.   Take prenatal classes with the father to understand, practice and ask questions about the labor and delivery.   Prepare the baby's room/nursery.   Do not travel out of the city unless it is absolutely necessary and with the advice of your caregiver.   Wear only low or no heal shoes to have better balance and prevent falling.  MEDICATIONS AND DRUG USE IN PREGNANCY  Take prenatal vitamins as directed. The vitamin should contain 1 milligram of folic acid. Keep all vitamins out of reach of children. Only a couple vitamins or tablets containing iron may be fatal  to a baby or young child when ingested.   Avoid use of all medications, including herbs, over-the-counter medications, not prescribed or suggested by your caregiver. Only take over-the-counter or prescription medicines for pain, discomfort, or fever as directed by your caregiver. Do not use aspirin, ibuprofen (Motrin, Advil, Nuprin) or naproxen (Aleve) unless OK'd by your caregiver.   Let your caregiver also know about herbs you may be using.   Alcohol is related to a number of birth defects. This includes fetal alcohol syndrome. All alcohol, in any form, should be avoided completely. Smoking will cause low birth rate and premature babies.   Street/illegal drugs are very harmful to the baby. They are absolutely forbidden. A baby born to an addicted mother will be addicted at birth. The baby will go through the same withdrawal an adult does.  SEEK MEDICAL CARE IF: You have any concerns or worries during your pregnancy. It is better to call with your questions if you feel they cannot wait, rather than worry about them. DECISIONS ABOUT CIRCUMCISION You may or may not know the sex of your baby. If you know your baby is a boy, it may be time to think about circumcision. Circumcision is the removal of the foreskin of the penis. This is the skin that covers the sensitive end of the penis. There is no proven medical need for this. Often this decision is made on what is popular at the time or based upon religious beliefs and social issues. You can discuss these issues with your caregiver or pediatrician. SEEK IMMEDIATE MEDICAL CARE IF:   An unexplained oral temperature above 102 F (38.9 C) develops, or as your caregiver suggests.   You have leaking of fluid from the vagina (birth canal). If leaking membranes are suspected, take your temperature and tell your caregiver of this when you call.   There is vaginal spotting, bleeding or passing clots. Tell your caregiver of the amount and how many pads are  used.   You develop a bad smelling vaginal discharge with a change in the color from clear to white.   You develop vomiting that lasts more than 24 hours.   You develop chills or fever.   You develop shortness of breath.   You develop burning on urination.   You loose more than 2 pounds of weight or gain more than 2 pounds of weight or as suggested by your   caregiver.   You notice sudden swelling of your face, hands, and feet or legs.   You develop belly (abdominal) pain. Round ligament discomfort is a common non-cancerous (benign) cause of abdominal pain in pregnancy. Your caregiver still must evaluate you.   You develop a severe headache that does not go away.   You develop visual problems, blurred or double vision.   If you have not felt your baby move for more than 1 hour. If you think the baby is not moving as much as usual, eat something with sugar in it and lie down on your left side for an hour. The baby should move at least 4 to 5 times per hour. Call right away if your baby moves less than that.   You fall, are in a car accident or any kind of trauma.   There is mental or physical violence at home.  Document Released: 11/09/2001 Document Revised: 11/04/2011 Document Reviewed: 05/14/2009 [employer] Patient Information 2012 [employer]. 

## 2012-04-05 LAB — CBC WITH DIFFERENTIAL/PLATELET
Basophils Absolute: 0 10*3/uL (ref 0.0–0.1)
Basophils Relative: 0 % (ref 0–1)
Eosinophils Absolute: 0.1 10*3/uL (ref 0.0–0.7)
Hemoglobin: 11 g/dL — ABNORMAL LOW (ref 12.0–15.0)
MCH: 28.5 pg (ref 26.0–34.0)
MCHC: 31.3 g/dL (ref 30.0–36.0)
Monocytes Relative: 5 % (ref 3–12)
Neutrophils Relative %: 72 % (ref 43–77)
Platelets: 325 10*3/uL (ref 150–400)
RDW: 14.8 % (ref 11.5–15.5)

## 2012-04-05 LAB — COMPREHENSIVE METABOLIC PANEL
AST: 13 U/L (ref 0–37)
Alkaline Phosphatase: 52 U/L (ref 39–117)
Glucose, Bld: 108 mg/dL — ABNORMAL HIGH (ref 70–99)
Sodium: 138 mEq/L (ref 135–145)
Total Bilirubin: 0.2 mg/dL — ABNORMAL LOW (ref 0.3–1.2)
Total Protein: 6 g/dL (ref 6.0–8.3)

## 2012-04-05 LAB — EPSTEIN-BARR VIRUS VCA, IGG: EBV VCA IgG: 4.18 U/mL — ABNORMAL HIGH

## 2012-04-05 LAB — EPSTEIN-BARR VIRUS VCA, IGM: EBV VCA IgM: 0.15 U/mL

## 2012-04-06 LAB — CMV IGM: CMV IgM: 0.08 (ref <0.90)

## 2012-04-18 ENCOUNTER — Encounter: Payer: Self-pay | Admitting: Obstetrics & Gynecology

## 2012-04-18 ENCOUNTER — Ambulatory Visit: Payer: Medicaid Other | Admitting: Obstetrics & Gynecology

## 2012-04-18 NOTE — Progress Notes (Signed)
Routine prenatal visit.  Doing well. 

## 2012-04-19 ENCOUNTER — Other Ambulatory Visit: Payer: Self-pay | Admitting: *Deleted

## 2012-04-19 DIAGNOSIS — O219 Vomiting of pregnancy, unspecified: Secondary | ICD-10-CM

## 2012-04-19 NOTE — Telephone Encounter (Signed)
Patient needs refill of zofran.

## 2012-04-26 ENCOUNTER — Encounter (HOSPITAL_COMMUNITY): Payer: Self-pay | Admitting: *Deleted

## 2012-04-26 ENCOUNTER — Inpatient Hospital Stay (HOSPITAL_COMMUNITY)
Admission: AD | Admit: 2012-04-26 | Discharge: 2012-04-26 | Disposition: A | Discharge status: Home / Self Care | Payer: Medicaid Other | Source: Ambulatory Visit | Attending: Obstetrics & Gynecology | Admitting: Obstetrics & Gynecology

## 2012-04-26 DIAGNOSIS — Z3490 Encounter for supervision of normal pregnancy, unspecified, unspecified trimester: Secondary | ICD-10-CM

## 2012-04-26 DIAGNOSIS — O360990 Maternal care for other rhesus isoimmunization, unspecified trimester, not applicable or unspecified: Secondary | ICD-10-CM

## 2012-04-26 DIAGNOSIS — O99891 Other specified diseases and conditions complicating pregnancy: Secondary | ICD-10-CM | POA: Insufficient documentation

## 2012-04-26 DIAGNOSIS — O26899 Other specified pregnancy related conditions, unspecified trimester: Secondary | ICD-10-CM

## 2012-04-26 DIAGNOSIS — B3731 Acute candidiasis of vulva and vagina: Secondary | ICD-10-CM

## 2012-04-26 DIAGNOSIS — O2390 Unspecified genitourinary tract infection in pregnancy, unspecified trimester: Secondary | ICD-10-CM | POA: Insufficient documentation

## 2012-04-26 DIAGNOSIS — B373 Candidiasis of vulva and vagina: Secondary | ICD-10-CM | POA: Insufficient documentation

## 2012-04-26 LAB — URINALYSIS, ROUTINE W REFLEX MICROSCOPIC
Bilirubin Urine: NEGATIVE
Ketones, ur: NEGATIVE mg/dL
Nitrite: NEGATIVE
Specific Gravity, Urine: 1.015 (ref 1.005–1.030)
Urobilinogen, UA: 0.2 mg/dL (ref 0.0–1.0)

## 2012-04-26 LAB — WET PREP, GENITAL
Clue Cells Wet Prep HPF POC: NONE SEEN
Trich, Wet Prep: NONE SEEN

## 2012-04-26 MED ORDER — FLUCONAZOLE 150 MG PO TABS: 150.0000 mg | ORAL_TABLET | ORAL | Status: AC

## 2012-04-26 MED ORDER — ONDANSETRON HCL 8 MG PO TABS: 8.0000 mg | ORAL_TABLET | Freq: Three times a day (TID) | ORAL | Status: AC | PRN

## 2012-04-26 MED ORDER — FLUCONAZOLE 150 MG PO TABS: 150.0000 mg | ORAL_TABLET | ORAL | Status: DC

## 2012-04-26 NOTE — Discharge Instructions (Signed)
Preterm Labor Preterm labor is when labor starts at less than 37 weeks of pregnancy. The normal length of a pregnancy is 39 to 41 weeks. CAUSES Often, there is no identifiable underlying cause as to why a woman goes into preterm labor. However, one of the most common known causes of preterm labor is infection. Infections of the uterus, cervix, vagina, amniotic sac, bladder, kidney, or even the lungs (pneumonia) can cause labor to start. Other causes of preterm labor include:  Urogenital infections, such as yeast infections and bacterial vaginosis.   Uterine abnormalities (uterine shape, uterine septum, fibroids, bleeding from the placenta).   A cervix that has been operated on and opens prematurely.   Malformations in the baby.   Multiple gestations (twins, triplets, and so on).   Breakage of the amniotic sac.  Additional risk factors for preterm labor include:  Previous history of preterm labor.   Premature rupture of membranes (PROM).   A placenta that covers the opening of the cervix (placenta previa).   A placenta that separates from the uterus (placenta abruption).   A cervix that is too weak to hold the baby in the uterus (incompetence cervix).   Having too much fluid in the amniotic sac (polyhydramnios).   Taking illegal drugs or smoking while pregnant.   Not gaining enough weight while pregnant.   Women younger than 45 and older than 25 years old.   Low socioeconomic status.   African-American ethnicity.  SYMPTOMS Signs and symptoms of preterm labor include:  Menstrual-like cramps.   Contractions that are 30 to 70 seconds apart, become very regular, closer together, and are more intense and painful.   Contractions that start on the top of the uterus and spread down to the lower abdomen and back.   A sense of increased pelvic pressure or back pain.   A watery or bloody discharge that comes from the vagina.  DIAGNOSIS  A diagnosis can be confirmed by:  A  vaginal exam.   An ultrasound of the cervix.   Sampling (swabbing) cervico-vaginal secretions. These samples can be tested for the presence of fetal fibronectin. This is a protein found in cervical discharge which is associated with preterm labor.   Fetal monitoring.  TREATMENT  Depending on the length of the pregnancy and other circumstances, a caregiver may suggest bed rest. If necessary, there are medicines that can be given to stop contractions and to quicken fetal lung maturity. If labor happens before 34 weeks of pregnancy, a prolonged hospital stay may be recommended. Treatment depends on the condition of both the mother and baby. PREVENTION There are some things a mother can do to lower the risk of preterm labor in future pregnancies. A woman can:   Stop smoking.   Maintain healthy weight gain and avoid chemicals and drugs that are not necessary.   Be watchful for any type of infection.   Inform her caregiver if she has a known history of preterm labor.  Document Released: 02/05/2004 Document Revised: 11/04/2011 Document Reviewed: 03/12/2011 [facility] Patient Information 2012 [place], [place].Candida Infection, Adult A candida infection (also called yeast, fungus and Monilia infection) is an overgrowth of yeast that can occur anywhere on the body. A yeast infection commonly occurs in warm, moist body areas. Usually, the infection remains localized but can spread to become a systemic infection. A yeast infection may be a sign of a more severe disease such as diabetes, leukemia, or AIDS. A yeast infection can occur in both men and women.  In women, Candida vaginitis is a vaginal infection. It is one of the most common causes of vaginitis. Men usually do not have symptoms or know they have an infection until other problems develop. Men may find out they have a yeast infection because their sex partner has a yeast infection. Uncircumcised men are more likely to get a yeast infection than  circumcised men. This is because the uncircumcised glans is not exposed to air and does not remain as dry as that of a circumcised glans. Older adults may develop yeast infections around dentures. CAUSES  Women  Antibiotics.   Steroid medication taken for a long time.   Being overweight (obese).   Diabetes.   Poor immune condition.   Certain serious medical conditions.   Immune suppressive medications for organ transplant patients.   Chemotherapy.   Pregnancy.   Menstration.   Stress and fatigue.   Intravenous drug use.   Oral contraceptives.   Wearing tight-fitting clothes in the crotch area.   Catching it from a sex partner who has a yeast infection.   Spermicide.   Intravenous, urinary, or other catheters.  Men  Catching it from a sex partner who has a yeast infection.   Having oral or anal sex with a person who has the infection.   Spermicide.   Diabetes.   Antibiotics.   Poor immune system.   Medications that suppress the immune system.   Intravenous drug use.   Intravenous, urinary, or other catheters.  SYMPTOMS  Women  Thick, white vaginal discharge.   Vaginal itching.   Redness and swelling in and around the vagina.   Irritation of the lips of the vagina and perineum.   Blisters on the vaginal lips and perineum.   Painful sexual intercourse.   Low blood sugar (hypoglycemia).   Painful urination.   Bladder infections.   Intestinal problems such as constipation, indigestion, bad breath, bloating, increase in gas, diarrhea, or loose stools.  Men  Men may develop intestinal problems such as constipation, indigestion, bad breath, bloating, increase in gas, diarrhea, or loose stools.   Dry, cracked skin on the penis with itching or discomfort.   Jock itch.   Dry, flaky skin.   Athlete's foot.   Hypoglycemia.  DIAGNOSIS  Women  A history and an exam are performed.   The discharge may be examined under a microscope.   A  culture may be taken of the discharge.  Men  A history and an exam are performed.   Any discharge from the penis or areas of cracked skin will be looked at under the microscope and cultured.   Stool samples may be cultured.  TREATMENT  Women  Vaginal antifungal suppositories and creams.   Medicated creams to decrease irritation and itching on the outside of the vagina.   Warm compresses to the perineal area to decrease swelling and discomfort.   Oral antifungal medications.   Medicated vaginal suppositories or cream for repeated or recurrent infections.   Wash and dry the irritation areas before applying the cream.   Eating yogurt with lactobacillus may help with prevention and treatment.   Sometimes painting the vagina with gentian violet solution may help if creams and suppositories do not work.  Men  Antifungal creams and oral antifungal medications.   Sometimes treatment must continue for 30 days after the symptoms go away to prevent recurrence.  HOME CARE INSTRUCTIONS  Women  Use cotton underwear and avoid tight-fitting clothing.   Avoid colored, scented toilet  paper and deodorant tampons or pads.   Do not douche.   Keep your diabetes under control.   Finish all the prescribed medications.   Keep your skin clean and dry.   Consume milk or yogurt with lactobacillus active culture regularly. If you get frequent yeast infections and think that is what the infection is, there are over-the-counter medications that you can get. If the infection does not show healing in 3 days, talk to your caregiver.   Tell your sex partner you have a yeast infection. Your partner may need treatment also, especially if your infection does not clear up or recurs.  Men  Keep your skin clean and dry.   Keep your diabetes under control.   Finish all prescribed medications.   Tell your sex partner that you have a yeast infection so they can be treated if necessary.  SEEK MEDICAL  CARE IF:   Your symptoms do not clear up or worsen in one week after treatment.   You have an oral temperature above 102 F (38.9 C).   You have trouble swallowing or eating for a prolonged time.   You develop blisters on and around your vagina.   You develop vaginal bleeding and it is not your menstrual period.   You develop abdominal pain.   You develop intestinal problems as mentioned above.   You get weak or lightheaded.   You have painful or increased urination.   You have pain during sexual intercourse.  MAKE SURE YOU:   Understand these instructions.   Will watch your condition.   Will get help right away if you are not doing well or get worse.  Document Released: 12/23/2004 Document Revised: 11/04/2011 Document Reviewed: 04/06/2010 [facility] Patient Information 2012 [place], [place].

## 2012-04-26 NOTE — MAU Note (Signed)
Woke up this AM and underclothes and pajama bottoms and bed linens were wet; ? SROM

## 2012-04-26 NOTE — MAU Note (Signed)
Patient states she had one instance of wet panties yesterday. Woke up this morning and her clothing and bed were wet. No active leaking at this time, not wearing a pad. Reports no bleeding and good fetal movement. Has some low back pain.
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
## 2012-04-26 NOTE — Telephone Encounter (Signed)
Addended by: Flay Ghosh L on: 04/26/2012 03:15 PM   Modules accepted: Orders  

## 2012-04-27 LAB — GC/CHLAMYDIA PROBE AMP, GENITAL
Chlamydia, DNA Probe: NEGATIVE
GC Probe Amp, Genital: NEGATIVE

## 2012-05-02 ENCOUNTER — Ambulatory Visit (INDEPENDENT_AMBULATORY_CARE_PROVIDER_SITE_OTHER): Payer: Medicaid Other | Admitting: Family Medicine

## 2012-05-02 ENCOUNTER — Encounter: Payer: Self-pay | Admitting: Family Medicine

## 2012-05-02 DIAGNOSIS — Z3480 Encounter for supervision of other normal pregnancy, unspecified trimester: Secondary | ICD-10-CM

## 2012-05-02 NOTE — Progress Notes (Signed)
Patient is doing well.  She was seen in MAU last week due to she felt as if her water broke, she woke up with her bed wet.

## 2012-05-02 NOTE — Progress Notes (Signed)
Doing well no complaints  

## 2012-05-02 NOTE — Patient Instructions (Signed)
Pregnancy - Third Trimester The third trimester of pregnancy (the last 3 months) is a period of the most rapid growth for you and your baby. The baby approaches a length of 20 inches and a weight of 6 to 10 pounds. The baby is adding on fat and getting ready for life outside your body. While inside, babies have periods of sleeping and waking, suck their thumbs, and hiccups. You can often feel small contractions of the uterus. This is false labor. It is also called Braxton-Hicks contractions. This is like a practice for labor. The usual problems in this stage of pregnancy include more difficulty breathing, swelling of the hands and feet from water retention, and having to urinate more often because of the uterus and baby pressing on your bladder.  PRENATAL EXAMS  Blood work may continue to be done during prenatal exams. These tests are done to check on your health and the probable health of your baby. Blood work is used to follow your blood levels (hemoglobin). Anemia (low hemoglobin) is common during pregnancy. Iron and vitamins are given to help prevent this. You may also continue to be checked for diabetes. Some of the past blood tests may be done again.   The size of the uterus is measured during each visit. This makes sure your baby is growing properly according to your pregnancy dates.   Your blood pressure is checked every prenatal visit. This is to make sure you are not getting toxemia.   Your urine is checked every prenatal visit for infection, diabetes and protein.   Your weight is checked at each visit. This is done to make sure gains are happening at the suggested rate and that you and your baby are growing normally.   Sometimes, an ultrasound is performed to confirm the position and the proper growth and development of the baby. This is a test done that bounces harmless sound waves off the baby so your caregiver can more accurately determine due dates.   Discuss the type of pain  medication and anesthesia you will have during your labor and delivery.   Discuss the possibility and anesthesia if a Cesarean Section might be necessary.   Inform your caregiver if there is any mental or physical violence at home.  Sometimes, a specialized non-stress test, contraction stress test and biophysical profile are done to make sure the baby is not having a problem. Checking the amniotic fluid surrounding the baby is called an amniocentesis. The amniotic fluid is removed by sticking a needle into the belly (abdomen). This is sometimes done near the end of pregnancy if an early delivery is required. In this case, it is done to help make sure the baby's lungs are mature enough for the baby to live outside of the womb. If the lungs are not mature and it is unsafe to deliver the baby, an injection of cortisone medication is given to the mother 1 to 2 days before the delivery. This helps the baby's lungs mature and makes it safer to deliver the baby. CHANGES OCCURING IN THE THIRD TRIMESTER OF PREGNANCY Your body goes through many changes during pregnancy. They vary from person to person. Talk to your caregiver about changes you notice and are concerned about.  During the last trimester, you have probably had an increase in your appetite. It is normal to have cravings for certain foods. This varies from person to person and pregnancy to pregnancy.   You may begin to get stretch marks on your hips,   abdomen, and breasts. These are normal changes in the body during pregnancy. There are no exercises or medications to take which prevent this change.   Constipation may be treated with a stool softener or adding bulk to your diet. Drinking lots of fluids, fiber in vegetables, fruits, and whole grains are helpful.   Exercising is also helpful. If you have been very active up until your pregnancy, most of these activities can be continued during your pregnancy. If you have been less active, it is helpful  to start an exercise program such as walking. Consult your caregiver before starting exercise programs.   Avoid all smoking, alcohol, un-prescribed drugs, herbs and "street drugs" during your pregnancy. These chemicals affect the formation and growth of the baby. Avoid chemicals throughout the pregnancy to ensure the delivery of a healthy infant.   Backache, varicose veins and hemorrhoids may develop or get worse.   You will tire more easily in the third trimester, which is normal.   The baby's movements may be stronger and more often.   You may become short of breath easily.   Your belly button may stick out.   A yellow discharge may leak from your breasts called colostrum.   You may have a bloody mucus discharge. This usually occurs a few days to a week before labor begins.  HOME CARE INSTRUCTIONS   Keep your caregiver's appointments. Follow your caregiver's instructions regarding medication use, exercise, and diet.   During pregnancy, you are providing food for you and your baby. Continue to eat regular, well-balanced meals. Choose foods such as meat, fish, milk and other low fat dairy products, vegetables, fruits, and whole-grain breads and cereals. Your caregiver will tell you of the ideal weight gain.   A physical sexual relationship may be continued throughout pregnancy if there are no other problems such as early (premature) leaking of amniotic fluid from the membranes, vaginal bleeding, or belly (abdominal) pain.   Exercise regularly if there are no restrictions. Check with your caregiver if you are unsure of the safety of your exercises. Greater weight gain will occur in the last 2 trimesters of pregnancy. Exercising helps:   Control your weight.   Get you in shape for labor and delivery.   You lose weight after you deliver.   Rest a lot with legs elevated, or as needed for leg cramps or low back pain.   Wear a good support or jogging bra for breast tenderness during  pregnancy. This may help if worn during sleep. Pads or tissues may be used in the bra if you are leaking colostrum.   Do not use hot tubs, steam rooms, or saunas.   Wear your seat belt when driving. This protects you and your baby if you are in an accident.   Avoid raw meat, cat litter boxes and soil used by cats. These carry germs that can cause birth defects in the baby.   It is easier to loose urine during pregnancy. Tightening up and strengthening the pelvic muscles will help with this problem. You can practice stopping your urination while you are going to the bathroom. These are the same muscles you need to strengthen. It is also the muscles you would use if you were trying to stop from passing gas. You can practice tightening these muscles up 10 times a set and repeating this about 3 times per day. Once you know what muscles to tighten up, do not perform these exercises during urination. It is more likely   to cause an infection by backing up the urine.   Ask for help if you have financial, counseling or nutritional needs during pregnancy. Your caregiver will be able to offer counseling for these needs as well as refer you for other special needs.   Make a list of emergency phone numbers and have them available.   Plan on getting help from family or friends when you go home from the hospital.   Make a trial run to the hospital.   Take prenatal classes with the father to understand, practice and ask questions about the labor and delivery.   Prepare the baby's room/nursery.   Do not travel out of the city unless it is absolutely necessary and with the advice of your caregiver.   Wear only low or no heal shoes to have better balance and prevent falling.  MEDICATIONS AND DRUG USE IN PREGNANCY  Take prenatal vitamins as directed. The vitamin should contain 1 milligram of folic acid. Keep all vitamins out of reach of children. Only a couple vitamins or tablets containing iron may be fatal  to a baby or young child when ingested.   Avoid use of all medications, including herbs, over-the-counter medications, not prescribed or suggested by your caregiver. Only take over-the-counter or prescription medicines for pain, discomfort, or fever as directed by your caregiver. Do not use aspirin, ibuprofen (Motrin, Advil, Nuprin) or naproxen (Aleve) unless OK'd by your caregiver.   Let your caregiver also know about herbs you may be using.   Alcohol is related to a number of birth defects. This includes fetal alcohol syndrome. All alcohol, in any form, should be avoided completely. Smoking will cause low birth rate and premature babies.   Street/illegal drugs are very harmful to the baby. They are absolutely forbidden. A baby born to an addicted mother will be addicted at birth. The baby will go through the same withdrawal an adult does.  SEEK MEDICAL CARE IF: You have any concerns or worries during your pregnancy. It is better to call with your questions if you feel they cannot wait, rather than worry about them. DECISIONS ABOUT CIRCUMCISION You may or may not know the sex of your baby. If you know your baby is a boy, it may be time to think about circumcision. Circumcision is the removal of the foreskin of the penis. This is the skin that covers the sensitive end of the penis. There is no proven medical need for this. Often this decision is made on what is popular at the time or based upon religious beliefs and social issues. You can discuss these issues with your caregiver or pediatrician. SEEK IMMEDIATE MEDICAL CARE IF:   An unexplained oral temperature above 102 F (38.9 C) develops, or as your caregiver suggests.   You have leaking of fluid from the vagina (birth canal). If leaking membranes are suspected, take your temperature and tell your caregiver of this when you call.   There is vaginal spotting, bleeding or passing clots. Tell your caregiver of the amount and how many pads are  used.   You develop a bad smelling vaginal discharge with a change in the color from clear to white.   You develop vomiting that lasts more than 24 hours.   You develop chills or fever.   You develop shortness of breath.   You develop burning on urination.   You loose more than 2 pounds of weight or gain more than 2 pounds of weight or as suggested by your   caregiver.   You notice sudden swelling of your face, hands, and feet or legs.   You develop belly (abdominal) pain. Round ligament discomfort is a common non-cancerous (benign) cause of abdominal pain in pregnancy. Your caregiver still must evaluate you.   You develop a severe headache that does not go away.   You develop visual problems, blurred or double vision.   If you have not felt your baby move for more than 1 hour. If you think the baby is not moving as much as usual, eat something with sugar in it and lie down on your left side for an hour. The baby should move at least 4 to 5 times per hour. Call right away if your baby moves less than that.   You fall, are in a car accident or any kind of trauma.   There is mental or physical violence at home.  Document Released: 11/09/2001 Document Revised: 11/04/2011 Document Reviewed: 05/14/2009 [employer] Patient Information 2012 [employer]. Contraception Choices Contraception (birth control) is the use of any methods or devices to prevent pregnancy. Below are some methods to help avoid pregnancy. HORMONAL METHODS   Contraceptive implant. This is a thin, plastic tube containing progesterone hormone. It does not contain estrogen hormone. Your caregiver inserts the tube in the inner part of the upper arm. The tube can remain in place for up to 3 years. After 3 years, the implant must be removed. The implant prevents the ovaries from releasing an egg (ovulation), thickens the cervical mucus which prevents sperm from entering the uterus, and thins the lining of the inside of the  uterus.   Progesterone-only injections. These injections are given every 3 months by your caregiver to prevent pregnancy. This synthetic progesterone hormone stops the ovaries from releasing eggs. It also thickens cervical mucus and changes the uterine lining. This makes it harder for sperm to survive in the uterus.   Birth control pills. These pills contain estrogen and progesterone hormone. They work by stopping the egg from forming in the ovary (ovulation). Birth control pills are prescribed by a caregiver.Birth control pills can also be used to treat heavy periods.   Minipill. This type of birth control pill contains only the progesterone hormone. They are taken every day of each month and must be prescribed by your caregiver.   Birth control patch. The patch contains hormones similar to those in birth control pills. It must be changed once a week and is prescribed by a caregiver.   Vaginal ring. The ring contains hormones similar to those in birth control pills. It is left in the vagina for 3 weeks, removed for 1 week, and then a new one is put back in place. The patient must be comfortable inserting and removing the ring from the vagina.A caregiver's prescription is necessary.   Emergency contraception. Emergency contraceptives prevent pregnancy after unprotected sexual intercourse. This pill can be taken right after sex or up to 5 days after unprotected sex. It is most effective the sooner you take the pills after having sexual intercourse. Emergency contraceptive pills are available without a prescription. Check with your pharmacist. Do not use emergency contraception as your only form of birth control.  BARRIER METHODS   Female condom. This is a thin sheath (latex or rubber) that is worn over the penis during sexual intercourse. It can be used with spermicide to increase effectiveness.   Female condom. This is a soft, loose-fitting sheath that is put into the vagina before sexual  intercourse.     Diaphragm. This is a soft, latex, dome-shaped barrier that must be fitted by a caregiver. It is inserted into the vagina, along with a spermicidal jelly. It is inserted before intercourse. The diaphragm should be left in the vagina for 6 to 8 hours after intercourse.   Cervical cap. This is a round, soft, latex or plastic cup that fits over the cervix and must be fitted by a caregiver. The cap can be left in place for up to 48 hours after intercourse.   Sponge. This is a soft, circular piece of polyurethane foam. The sponge has spermicide in it. It is inserted into the vagina after wetting it and before sexual intercourse.   Spermicides. These are chemicals that kill or block sperm from entering the cervix and uterus. They come in the form of creams, jellies, suppositories, foam, or tablets. They do not require a prescription. They are inserted into the vagina with an applicator before having sexual intercourse. The process must be repeated every time you have sexual intercourse.  INTRAUTERINE CONTRACEPTION  Intrauterine device (IUD). This is a T-shaped device that is put in a woman's uterus during a menstrual period to prevent pregnancy. There are 2 types:   Copper IUD. This type of IUD is wrapped in copper wire and is placed inside the uterus. Copper makes the uterus and fallopian tubes produce a fluid that kills sperm. It can stay in place for 10 years.   Hormone IUD. This type of IUD contains the hormone progestin (synthetic progesterone). The hormone thickens the cervical mucus and prevents sperm from entering the uterus, and it also thins the uterine lining to prevent implantation of a fertilized egg. The hormone can weaken or kill the sperm that get into the uterus. It can stay in place for 5 years.  PERMANENT METHODS OF CONTRACEPTION  Female tubal ligation. This is when the woman's fallopian tubes are surgically sealed, tied, or blocked to prevent the egg from traveling to  the uterus.   Female sterilization. This is when the female has the tubes that carry sperm tied off (vasectomy).This blocks sperm from entering the vagina during sexual intercourse. After the procedure, the man can still ejaculate fluid (semen).  NATURAL PLANNING METHODS  Natural family planning. This is not having sexual intercourse or using a barrier method (condom, diaphragm, cervical cap) on days the woman could become pregnant.   Calendar method. This is keeping track of the length of each menstrual cycle and identifying when you are fertile.   Ovulation method. This is avoiding sexual intercourse during ovulation.   Symptothermal method. This is avoiding sexual intercourse during ovulation, using a thermometer and ovulation symptoms.   Post-ovulation method. This is timing sexual intercourse after you have ovulated.  Regardless of which type or method of contraception you choose, it is important that you use condoms to protect against the transmission of sexually transmitted diseases (STDs). Talk with your caregiver about which form of contraception is most appropriate for you. Document Released: 11/15/2005 Document Revised: 11/04/2011 Document Reviewed: 03/24/2011 [employer] Patient Information 2012 [employer]. Breastfeeding BENEFITS OF BREASTFEEDING For the baby  The first milk (colostrum) helps the baby's digestive system function better.   There are antibodies from the mother in the milk that help the baby fight off infections.   The baby has a lower incidence of asthma, allergies, and SIDS (sudden infant death syndrome).   The nutrients in breast milk are better than formulas for the baby and helps the baby's brain grow better.     Babies who breastfeed have less gas, colic, and constipation.  For the mother  Breastfeeding helps develop a very special bond between mother and baby.   It is more convenient, always available at the correct temperature and cheaper than formula  feeding.   It burns calories in the mother and helps with losing weight that was gained during pregnancy.   It makes the uterus contract back down to normal size faster and slows bleeding following delivery.   Breastfeeding mothers have a lower risk of developing breast cancer.  NURSE FREQUENTLY  A healthy, full-term baby may breastfeed as often as every hour or space his or her feedings to every 3 hours.   How often to nurse will vary from baby to baby. Watch your baby for signs of hunger, not the clock.   Nurse as often as the baby requests, or when you feel the need to reduce the fullness of your breasts.   Awaken the baby if it has been 3 to 4 hours since the last feeding.   Frequent feeding will help the mother make more milk and will prevent problems like sore nipples and engorgement of the breasts.  BABY'S POSITION AT THE BREAST  Whether lying down or sitting, be sure that the baby's tummy is facing your tummy.   Support the breast with 4 fingers underneath the breast and the thumb above. Make sure your fingers are well away from the nipple and baby's mouth.   Stroke the baby's lips and cheek closest to the breast gently with your finger or nipple.   When the baby's mouth is open wide enough, place all of your nipple and as much of the dark area around the nipple as possible into your baby's mouth.   Pull the baby in close so the tip of the nose and the baby's cheeks touch the breast during the feeding.  FEEDINGS  The length of each feeding varies from baby to baby and from feeding to feeding.   The baby must suck about 2 to 3 minutes for your milk to get to him or her. This is called a "let down." For this reason, allow the baby to feed on each breast as long as he or she wants. Your baby will end the feeding when he or she has received the right balance of nutrients.   To break the suction, put your finger into the corner of the baby's mouth and slide it between his or her  gums before removing your breast from his or her mouth. This will help prevent sore nipples.  REDUCING BREAST ENGORGEMENT  In the first week after your baby is born, you may experience signs of breast engorgement. When breasts are engorged, they feel heavy, warm, full, and may be tender to the touch. You can reduce engorgement if you:   Nurse frequently, every 2 to 3 hours. Mothers who breastfeed early and often have fewer problems with engorgement.   Place light ice packs on your breasts between feedings. This reduces swelling. Wrap the ice packs in a lightweight towel to protect your skin.   Apply moist hot packs to your breast for 5 to 10 minutes before each feeding. This increases circulation and helps the milk flow.   Gently massage your breast before and during the feeding.   Make sure that the baby empties at least one breast at every feeding before switching sides.   Use a breast pump to empty the breasts if your baby is sleepy or   not nursing well. You may also want to pump if you are returning to work or or you feel you are getting engorged.   Avoid bottle feeds, pacifiers or supplemental feedings of water or juice in place of breastfeeding.   Be sure the baby is latched on and positioned properly while breastfeeding.   Prevent fatigue, stress, and anemia.   Wear a supportive bra, avoiding underwire styles.   Eat a balanced diet with enough fluids.  If you follow these suggestions, your engorgement should improve in 24 to 48 hours. If you are still experiencing difficulty, call your lactation consultant or caregiver. IS MY BABY GETTING ENOUGH MILK? Sometimes, mothers worry about whether their babies are getting enough milk. You can be assured that your baby is getting enough milk if:  The baby is actively sucking and you hear swallowing.   The baby nurses at least 8 to 12 times in a 24 hour time period. Nurse your baby until he or she unlatches or falls asleep at the first  breast (at least 10 to 20 minutes), then offer the second side.   The baby is wetting 5 to 6 disposable diapers (6 to 8 cloth diapers) in a 24 hour period by 5 to 6 days of age.   The baby is having at least 2 to 3 stools every 24 hours for the first few months. Breast milk is all the food your baby needs. It is not necessary for your baby to have water or formula. In fact, to help your breasts make more milk, it is best not to give your baby supplemental feedings during the early weeks.   The stool should be soft and yellow.   The baby should gain 4 to 7 ounces per week after he is 4 days old.  TAKE CARE OF YOURSELF Take care of your breasts by:  Bathing or showering daily.   Avoiding the use of soaps on your nipples.   Start feedings on your left breast at one feeding and on your right breast at the next feeding.   You will notice an increase in your milk supply 2 to 5 days after delivery. You may feel some discomfort from engorgement, which makes your breasts very firm and often tender. Engorgement "peaks" out within 24 to 48 hours. In the meantime, apply warm moist towels to your breasts for 5 to 10 minutes before feeding. Gentle massage and expression of some milk before feeding will soften your breasts, making it easier for your baby to latch on. Wear a well fitting nursing bra and air dry your nipples for 10 to 15 minutes after each feeding.   Only use cotton bra pads.   Only use pure lanolin on your nipples after nursing. You do not need to wash it off before nursing.  Take care of yourself by:   Eating well-balanced meals and nutritious snacks.   Drinking milk, fruit juice, and water to satisfy your thirst (about 8 glasses a day).   Getting plenty of rest.   Increasing calcium in your diet (1200 mg a day).   Avoiding foods that you notice affect the baby in a bad way.  SEEK MEDICAL CARE IF:   You have any questions or difficulty with breastfeeding.   You need help.    You have a hard, red, sore area on your breast, accompanied by a fever of 100.5 F (38.1 C) or more.   Your baby is too sleepy to eat well or is having   trouble sleeping.   Your baby is wetting less than 6 diapers per day, by 5 days of age.   Your baby's skin or white part of his or her eyes is more yellow than it was in the hospital.   You feel depressed.  Document Released: 11/15/2005 Document Revised: 11/04/2011 Document Reviewed: 06/30/2009 [employer] Patient Information 2012 [employer]. 

## 2012-05-16 ENCOUNTER — Ambulatory Visit (INDEPENDENT_AMBULATORY_CARE_PROVIDER_SITE_OTHER): Payer: Medicaid Other | Admitting: Obstetrics and Gynecology

## 2012-05-16 ENCOUNTER — Encounter: Payer: Self-pay | Admitting: Obstetrics and Gynecology

## 2012-05-16 ENCOUNTER — Other Ambulatory Visit: Payer: Self-pay | Admitting: Obstetrics and Gynecology

## 2012-05-16 VITALS — BP 120/66 | Wt 193.0 lb

## 2012-05-16 DIAGNOSIS — O26899 Other specified pregnancy related conditions, unspecified trimester: Secondary | ICD-10-CM

## 2012-05-16 DIAGNOSIS — Z3480 Encounter for supervision of other normal pregnancy, unspecified trimester: Secondary | ICD-10-CM

## 2012-05-16 DIAGNOSIS — N898 Other specified noninflammatory disorders of vagina: Secondary | ICD-10-CM

## 2012-05-16 DIAGNOSIS — Z3490 Encounter for supervision of normal pregnancy, unspecified, unspecified trimester: Secondary | ICD-10-CM

## 2012-05-16 DIAGNOSIS — O360990 Maternal care for other rhesus isoimmunization, unspecified trimester, not applicable or unspecified: Secondary | ICD-10-CM

## 2012-05-16 NOTE — Progress Notes (Signed)
Patient got out of the pool last night and felt a warm trickle down her leg.  It did stop but it was definitely a watery discharge.  She is having increased pressure and increased irregular contractions that last a little longer than her braxton hicks had been.

## 2012-05-16 NOTE — Progress Notes (Signed)
Patient doing well. Report some concerns of LOF yesterday but none today. SSE: neg pooling, neg nitrazine, copious white discharge. Wet prep collected. FM/PTL precautions reviewed

## 2012-05-16 NOTE — Addendum Note (Signed)
Addended by: Barbara Cower on: 05/16/2012 02:24 PM   Modules accepted: Orders

## 2012-05-21 ENCOUNTER — Inpatient Hospital Stay (HOSPITAL_COMMUNITY)
Admission: AD | Admit: 2012-05-21 | Discharge: 2012-05-21 | Disposition: A | Discharge status: Hospice | Payer: Medicaid Other | Source: Ambulatory Visit | Attending: Obstetrics & Gynecology | Admitting: Obstetrics & Gynecology

## 2012-05-21 ENCOUNTER — Encounter (HOSPITAL_COMMUNITY): Payer: Self-pay | Admitting: *Deleted

## 2012-05-21 DIAGNOSIS — O360990 Maternal care for other rhesus isoimmunization, unspecified trimester, not applicable or unspecified: Secondary | ICD-10-CM

## 2012-05-21 DIAGNOSIS — Z3490 Encounter for supervision of normal pregnancy, unspecified, unspecified trimester: Secondary | ICD-10-CM

## 2012-05-21 DIAGNOSIS — N949 Unspecified condition associated with female genital organs and menstrual cycle: Secondary | ICD-10-CM | POA: Insufficient documentation

## 2012-05-21 DIAGNOSIS — O99891 Other specified diseases and conditions complicating pregnancy: Secondary | ICD-10-CM | POA: Insufficient documentation

## 2012-05-21 DIAGNOSIS — Z6791 Unspecified blood type, Rh negative: Secondary | ICD-10-CM

## 2012-05-21 DIAGNOSIS — O26899 Other specified pregnancy related conditions, unspecified trimester: Secondary | ICD-10-CM

## 2012-05-21 DIAGNOSIS — O4702 False labor before 37 completed weeks of gestation, second trimester: Secondary | ICD-10-CM

## 2012-05-21 DIAGNOSIS — O212 Late vomiting of pregnancy: Secondary | ICD-10-CM | POA: Insufficient documentation

## 2012-05-21 NOTE — MAU Note (Signed)
Pt says she got out of shower, dried off, and stood up and had a milky white discharge run down her leg, went to church, had wet underware, put on a pad and it was damp.  She also states her low back started hurting this morning.  Denies bleeding or urinary problems.
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
## 2012-05-24 ENCOUNTER — Ambulatory Visit (INDEPENDENT_AMBULATORY_CARE_PROVIDER_SITE_OTHER): Payer: Medicaid Other | Admitting: Obstetrics and Gynecology

## 2012-05-24 VITALS — BP 109/65 | Wt 192.0 lb

## 2012-05-24 DIAGNOSIS — O360990 Maternal care for other rhesus isoimmunization, unspecified trimester, not applicable or unspecified: Secondary | ICD-10-CM

## 2012-05-24 DIAGNOSIS — Z3480 Encounter for supervision of other normal pregnancy, unspecified trimester: Secondary | ICD-10-CM

## 2012-05-24 NOTE — Progress Notes (Signed)
Patient doing well. FM/PTL precautions discussed. Patient declined exam today, will do cultures at next visit

## 2012-05-29 ENCOUNTER — Ambulatory Visit (INDEPENDENT_AMBULATORY_CARE_PROVIDER_SITE_OTHER): Payer: Medicaid Other | Admitting: Obstetrics & Gynecology

## 2012-05-29 VITALS — BP 118/91 | Temp 97.3°F | Wt 193.1 lb

## 2012-05-29 DIAGNOSIS — N9489 Other specified conditions associated with female genital organs and menstrual cycle: Secondary | ICD-10-CM

## 2012-05-29 DIAGNOSIS — Z3480 Encounter for supervision of other normal pregnancy, unspecified trimester: Secondary | ICD-10-CM

## 2012-05-29 DIAGNOSIS — O26899 Other specified pregnancy related conditions, unspecified trimester: Secondary | ICD-10-CM

## 2012-05-29 DIAGNOSIS — Z3490 Encounter for supervision of normal pregnancy, unspecified, unspecified trimester: Secondary | ICD-10-CM

## 2012-05-29 DIAGNOSIS — N949 Unspecified condition associated with female genital organs and menstrual cycle: Secondary | ICD-10-CM

## 2012-05-29 DIAGNOSIS — Z6791 Unspecified blood type, Rh negative: Secondary | ICD-10-CM

## 2012-05-29 DIAGNOSIS — R102 Pelvic and perineal pain: Secondary | ICD-10-CM

## 2012-05-29 DIAGNOSIS — N390 Urinary tract infection, site not specified: Secondary | ICD-10-CM

## 2012-05-29 DIAGNOSIS — O360990 Maternal care for other rhesus isoimmunization, unspecified trimester, not applicable or unspecified: Secondary | ICD-10-CM

## 2012-05-29 LAB — POCT URINALYSIS DIPSTICK
Bilirubin, UA: NEGATIVE
Ketones, UA: NEGATIVE
Leukocytes, UA: NEGATIVE

## 2012-05-29 NOTE — Patient Instructions (Signed)
Return to clinic for any obstetric concerns or go to MAU for evaluation  

## 2012-05-29 NOTE — Progress Notes (Signed)
UTI symptoms/ back pain

## 2012-05-29 NOTE — Progress Notes (Signed)
Complains of vulvar irritation during urination. UA neg, culture sent.  White discharge seen in vagina, wet prep sample obtained. GBS, GC/Chlam cultures obtained, cervix closed.  Check presentation next time on u/s if unable to palpate presenting part. No other complaints or concerns.  Fetal movement and labor precautions reviewed.

## 2012-05-30 LAB — WET PREP, GENITAL
Trich, Wet Prep: NONE SEEN
Yeast Wet Prep HPF POC: NONE SEEN

## 2012-05-30 LAB — GC/CHLAMYDIA PROBE AMP, GENITAL: GC Probe Amp, Genital: NEGATIVE

## 2012-05-30 NOTE — MAU Provider Note (Signed)
Agree with above note.  Kamarius Buckbee H. 05/30/2012 8:23 PM

## 2012-05-31 LAB — CULTURE, OB URINE
Colony Count: NO GROWTH
Organism ID, Bacteria: NO GROWTH

## 2012-06-02 ENCOUNTER — Encounter: Payer: Self-pay | Admitting: Obstetrics & Gynecology

## 2012-06-05 LAB — HM PAP SMEAR: HM PAP: NORMAL

## 2012-06-07 ENCOUNTER — Ambulatory Visit (INDEPENDENT_AMBULATORY_CARE_PROVIDER_SITE_OTHER): Payer: Medicaid Other | Admitting: Obstetrics & Gynecology

## 2012-06-07 VITALS — BP 131/68 | Wt 194.0 lb

## 2012-06-07 DIAGNOSIS — K219 Gastro-esophageal reflux disease without esophagitis: Secondary | ICD-10-CM

## 2012-06-07 MED ORDER — PANTOPRAZOLE SODIUM 40 MG PO TBEC: 40.0000 mg | DELAYED_RELEASE_TABLET | Freq: Every day | ORAL | Status: DC

## 2012-06-07 NOTE — Patient Instructions (Signed)
Heartburn During Pregnancy  Heartburn is a burning sensation in the chest caused by stomach acid backing up into the esophagus. Heartburn (also known as "reflux") is common in pregnancy because a certain hormone (progesterone) changes. The progesterone hormone may relax the valve that separates the esophagus from the stomach. This allows acid to go up into the esophagus, causing heartburn. Heartburn may also happen in pregnancy because the enlarging uterus pushes up on the stomach, which pushes more acid into the esophagus. This is especially true in the later stages of pregnancy. Heartburn problems usually go away after giving birth. CAUSES   The progesterone hormone.   Changing hormone levels.   The growing uterus that pushes stomach acid upward.   Large meals.   Certain foods and drinks.   Exercise.   Increased acid production.  SYMPTOMS   Burning pain in the chest or lower throat.   Bitter taste in the mouth.   Coughing.  DIAGNOSIS  Heartburn is typically diagnosed by your caregiver when taking a careful history of your concern. Your caregiver may order a blood test to check for a certain type of bacteria that is associated with heartburn. Sometimes, heartburn is diagnosed by prescribing a heartburn medicine to see if the symptoms improve. It is rare in pregnancy to have a procedure called an endoscopy. This is when a tube with a light and a camera on the end is used to examine the esophagus and the stomach. TREATMENT   Your caregiver may tell you to use certain over-the-counter medicines (antacids, acid reducers) for mild heartburn.   Your caregiver may prescribe medicines to decrease stomach acid or to protect your stomach lining.   Your caregiver may recommend certain diet changes.   For severe cases, your caregiver may recommend that the head of the bed be elevated on blocks. (Sleeping with more pillows is not an effective treatment as it only changes the position of your  head and does not improve the main problem of stomach acid refluxing into the esophagus.)  HOME CARE INSTRUCTIONS   Take all medicines as directed by your caregiver.   Raise the head of your bed by putting blocks under the legs if instructed to by your caregiver.   Do not exercise right after eating.   Avoid eating 2 or 3 hours before bed. Do not lie down right after eating.   Eat small meals throughout the day instead of 3 large meals.   Identify foods and beverages that make your symptoms worse and avoid them. Foods you may want to avoid include:   Peppers.   Chocolate.   High-fat foods, including fried foods.   Spicy foods.   Garlic and onions.   Citrus fruits, including oranges, grapefruit, lemons, and limes.   Food containing tomatoes or tomato products.   Mint.   Carbonated and caffeinated drinks.   Vinegar.  SEEK IMMEDIATE MEDICAL CARE IF:   You have severe chest pain that goes down your arm or into your jaw or neck.   You feel sweaty, dizzy, or lightheaded.   You become short of breath.   You vomit blood.   You have difficulty or pain with swallowing.   You have bloody or black, tarry stools.   You have episodes of heartburn more than 3 times a week, for more than 2 weeks.  MAKE SURE YOU:  Understand these instructions.   Will watch your condition.   Will get help right away if you are not doing well or   get worse.  Document Released: 11/12/2000 Document Revised: 11/04/2011 Document Reviewed: 05/06/2011 ExitCare Patient Information 2012 [employer]. 

## 2012-06-07 NOTE — Progress Notes (Signed)
Rare contractions, no complaints. GBS negative. Still with reflux despite taking Zantac. Will switch to Protonix.

## 2012-06-08 ENCOUNTER — Ambulatory Visit (INDEPENDENT_AMBULATORY_CARE_PROVIDER_SITE_OTHER): Payer: Medicaid Other | Admitting: Pediatrics

## 2012-06-08 DIAGNOSIS — Z7681 Expectant parent(s) prebirth pediatrician visit: Secondary | ICD-10-CM

## 2012-06-14 ENCOUNTER — Ambulatory Visit (INDEPENDENT_AMBULATORY_CARE_PROVIDER_SITE_OTHER): Payer: Medicaid Other | Admitting: Obstetrics & Gynecology

## 2012-06-14 VITALS — BP 109/70 | Wt 195.0 lb

## 2012-06-14 DIAGNOSIS — O26899 Other specified pregnancy related conditions, unspecified trimester: Secondary | ICD-10-CM

## 2012-06-14 DIAGNOSIS — O360990 Maternal care for other rhesus isoimmunization, unspecified trimester, not applicable or unspecified: Secondary | ICD-10-CM

## 2012-06-14 DIAGNOSIS — Z6791 Unspecified blood type, Rh negative: Secondary | ICD-10-CM

## 2012-06-14 DIAGNOSIS — O219 Vomiting of pregnancy, unspecified: Secondary | ICD-10-CM

## 2012-06-14 DIAGNOSIS — Z3480 Encounter for supervision of other normal pregnancy, unspecified trimester: Secondary | ICD-10-CM

## 2012-06-14 DIAGNOSIS — Z3490 Encounter for supervision of normal pregnancy, unspecified, unspecified trimester: Secondary | ICD-10-CM

## 2012-06-14 MED ORDER — ONDANSETRON HCL 4 MG PO TABS: 4.0000 mg | ORAL_TABLET | Freq: Four times a day (QID) | ORAL | Status: DC | PRN

## 2012-06-14 NOTE — Patient Instructions (Signed)
Return to clinic for any obstetric concerns or go to MAU for evaluation  

## 2012-06-14 NOTE — Progress Notes (Signed)
Irregular contractions, pelvic pressure and increase in watery discharge, otherwise doing well.

## 2012-06-14 NOTE — Progress Notes (Signed)
Vertex on ultrasound. No other complaints or concerns.  Fetal movement and labor precautions reviewed.

## 2012-06-22 ENCOUNTER — Ambulatory Visit (INDEPENDENT_AMBULATORY_CARE_PROVIDER_SITE_OTHER): Payer: Medicaid Other | Admitting: Obstetrics & Gynecology

## 2012-06-22 VITALS — BP 121/67 | Wt 195.0 lb

## 2012-06-22 DIAGNOSIS — O480 Post-term pregnancy: Secondary | ICD-10-CM

## 2012-06-22 DIAGNOSIS — O368190 Decreased fetal movements, unspecified trimester, not applicable or unspecified: Secondary | ICD-10-CM

## 2012-06-22 DIAGNOSIS — Z3480 Encounter for supervision of other normal pregnancy, unspecified trimester: Secondary | ICD-10-CM

## 2012-06-22 NOTE — Progress Notes (Signed)
NST is reactive.  Patient will return to clinic next week on Friday for appointment and NST.  She will be seen at [facility] Tuesday for NST and BPP.

## 2012-06-22 NOTE — Patient Instructions (Signed)
Normal Labor and Delivery Your caregiver must first be sure you are in labor. Signs of labor include:  You may pass what is called "the mucus plug" before labor begins. This is a small amount of blood stained mucus.   Regular uterine contractions.   The time between contractions get closer together.   The discomfort and pain gradually gets more intense.   Pains are mostly located in the back.   Pains get worse when walking.   The cervix (the opening of the uterus becomes thinner (begins to efface) and opens up (dilates).  Once you are in labor and admitted into the hospital or care center, your caregiver will do the following:  A complete physical examination.   Check your vital signs (blood pressure, pulse, temperature and the fetal heart rate).   Do a vaginal examination (using a sterile glove and lubricant) to determine:   The position (presentation) of the baby (head [vertex] or buttock first).   The level (station) of the baby's head in the birth canal.   The effacement and dilatation of the cervix.   You may have your pubic hair shaved and be given an enema depending on your caregiver and the circumstance.   An electronic monitor is usually placed on your abdomen. The monitor follows the length and intensity of the contractions, as well as the baby's heart rate.   Usually, your caregiver will insert an IV in your arm with a bottle of sugar water. This is done as a precaution so that medications can be given to you quickly during labor or delivery.  NORMAL LABOR AND DELIVERY IS DIVIDED UP INTO 3 STAGES: First Stage This is when regular contractions begin and the cervix begins to efface and dilate. This stage can last from 3 to 15 hours. The end of the first stage is when the cervix is 100% effaced and 10 centimeters dilated. Pain medications may be given by   Injection (morphine, demerol, etc.)   Regional anesthesia (spinal, caudal or epidural, anesthetics given in  different locations of the spine). Paracervical pain medication may be given, which is an injection of and anesthetic on each side of the cervix.  A pregnant woman may request to have "Natural Childbirth" which is not to have any medications or anesthesia during her labor and delivery. Second Stage This is when the baby comes down through the birth canal (vagina) and is born. This can take 1 to 4 hours. As the baby's head comes down through the birth canal, you may feel like you are going to have a bowel movement. You will get the urge to bear down and push until the baby is delivered. As the baby's head is being delivered, the caregiver will decide if an episiotomy (a cut in the perineum and vagina area) is needed to prevent tearing of the tissue in this area. The episiotomy is sewn up after the delivery of the baby and placenta. Sometimes a mask with nitrous oxide is given for the mother to breath during the delivery of the baby to help if there is too much pain. The end of Stage 2 is when the baby is fully delivered. Then when the umbilical cord stops pulsating it is clamped and cut. Third Stage The third stage begins after the baby is completely delivered and ends after the placenta (afterbirth) is delivered. This usually takes 5 to 30 minutes. After the placenta is delivered, a medication is given either by intravenous or injection to help contract   the uterus and prevent bleeding. The third stage is not painful and pain medication is usually not necessary. If an episiotomy was done, it is repaired at this time. After the delivery, the mother is watched and monitored closely for 1 to 2 hours to make sure there is no postpartum bleeding (hemorrhage). If there is a lot of bleeding, medication is given to contract the uterus and stop the bleeding. Document Released: 08/24/2008 Document Revised: 11/04/2011 Document Reviewed: 08/24/2008 ExitCare Patient Information 2012 [employer]. 

## 2012-06-22 NOTE — Progress Notes (Signed)
Routine prenatal check, doing well other than not feeling the baby move around as much, still feeling rolling movement but no kicks that she can count.

## 2012-06-22 NOTE — Progress Notes (Signed)
Pt c/o decreased fatal movement.  On further questions she reports that she feels 'movement' but, not 'kicks'.  Pt educated on fetal movement in pregnancy. Rec del at 41 weeks if no labor prior to that time. F/ 1 week or sooner prn Reviewed labor precautions.  Marquitta Persichetti L. Harraway-Smith, M.D., [place]

## 2012-06-27 ENCOUNTER — Telehealth (HOSPITAL_COMMUNITY): Payer: Self-pay | Admitting: *Deleted

## 2012-06-27 ENCOUNTER — Ambulatory Visit (INDEPENDENT_AMBULATORY_CARE_PROVIDER_SITE_OTHER): Payer: Medicaid Other | Admitting: *Deleted

## 2012-06-27 ENCOUNTER — Encounter (HOSPITAL_COMMUNITY): Payer: Self-pay | Admitting: *Deleted

## 2012-06-27 VITALS — BP 113/55 | Wt 196.2 lb

## 2012-06-27 DIAGNOSIS — O480 Post-term pregnancy: Secondary | ICD-10-CM

## 2012-06-27 NOTE — Progress Notes (Signed)
P = 97  Pt reports decr. FM x1 wk- very good FM during NST and pt was aware of FM.  IOL scheduled 06/30/12 @ 1930.

## 2012-06-27 NOTE — Telephone Encounter (Signed)
Preadmission screen  

## 2012-06-27 NOTE — Progress Notes (Signed)
Reactive NST  Rogerick Baldwin L. Harraway-Smith, M.D., FACOG  

## 2012-06-30 ENCOUNTER — Encounter: Payer: Medicaid Other | Admitting: Family Medicine

## 2012-06-30 ENCOUNTER — Encounter (HOSPITAL_COMMUNITY): Payer: Self-pay

## 2012-06-30 ENCOUNTER — Inpatient Hospital Stay (HOSPITAL_COMMUNITY)
Admission: RE | Admit: 2012-06-30 | Discharge: 2012-07-03 | DRG: 775 | Disposition: A | Discharge status: Home / Self Care | Payer: Medicaid Other | Source: Ambulatory Visit | Attending: Obstetrics and Gynecology | Admitting: Obstetrics and Gynecology

## 2012-06-30 VITALS — BP 134/76 | HR 74 | Temp 98.2°F | Resp 20 | Ht 64.0 in | Wt 196.0 lb

## 2012-06-30 DIAGNOSIS — Z3490 Encounter for supervision of normal pregnancy, unspecified, unspecified trimester: Secondary | ICD-10-CM

## 2012-06-30 DIAGNOSIS — Z6791 Unspecified blood type, Rh negative: Secondary | ICD-10-CM

## 2012-06-30 DIAGNOSIS — O26899 Other specified pregnancy related conditions, unspecified trimester: Secondary | ICD-10-CM

## 2012-06-30 DIAGNOSIS — O480 Post-term pregnancy: Principal | ICD-10-CM | POA: Diagnosis present

## 2012-06-30 LAB — CBC
HCT: 35.7 % — ABNORMAL LOW (ref 36.0–46.0)
Hemoglobin: 11.7 g/dL — ABNORMAL LOW (ref 12.0–15.0)
MCHC: 32.8 g/dL (ref 30.0–36.0)
MCV: 86 fL (ref 78.0–100.0)
RDW: 14.9 % (ref 11.5–15.5)

## 2012-06-30 MED ORDER — OXYTOCIN 40 UNITS IN LACTATED RINGERS INFUSION - SIMPLE MED: 62.5000 mL/h | Freq: Once | INTRAVENOUS | Status: DC

## 2012-06-30 MED ORDER — HYDROXYZINE HCL 50 MG/ML IM SOLN: 50.0000 mg | Freq: Four times a day (QID) | INTRAMUSCULAR | Status: DC | PRN

## 2012-06-30 MED ORDER — OXYTOCIN 40 UNITS IN LACTATED RINGERS INFUSION - SIMPLE MED
1.0000 m[IU]/min | INTRAVENOUS | Status: DC
  Administered 2012-07-01: 2 m[IU]/min via INTRAVENOUS
  Filled 2012-06-30: qty 1000

## 2012-06-30 MED ORDER — LIDOCAINE HCL (PF) 1 % IJ SOLN
30.0000 mL | INTRAMUSCULAR | Status: DC | PRN
  Filled 2012-06-30: qty 30

## 2012-06-30 MED ORDER — TERBUTALINE SULFATE 1 MG/ML IJ SOLN: 0.2500 mg | Freq: Once | INTRAMUSCULAR | Status: AC | PRN

## 2012-06-30 MED ORDER — FENTANYL CITRATE 0.05 MG/ML IJ SOLN
50.0000 ug | INTRAMUSCULAR | Status: DC | PRN
  Administered 2012-07-01 (×3): 50 ug via INTRAVENOUS
  Filled 2012-06-30 (×3): qty 2

## 2012-06-30 MED ORDER — LACTATED RINGERS IV SOLN
INTRAVENOUS | Status: DC
  Administered 2012-06-30: 20:00:00 via INTRAVENOUS
  Administered 2012-07-01: 400 mL via INTRAVENOUS
  Administered 2012-07-01 (×3): via INTRAVENOUS

## 2012-06-30 MED ORDER — MISOPROSTOL 25 MCG QUARTER TABLET
25.0000 ug | ORAL_TABLET | ORAL | Status: DC | PRN
  Administered 2012-06-30: 25 ug via VAGINAL
  Filled 2012-06-30 (×2): qty 0.25

## 2012-06-30 MED ORDER — ACETAMINOPHEN 325 MG PO TABS: 650.0000 mg | ORAL_TABLET | ORAL | Status: DC | PRN

## 2012-06-30 MED ORDER — ZOLPIDEM TARTRATE 5 MG PO TABS: 5.0000 mg | ORAL_TABLET | Freq: Every evening | ORAL | Status: DC | PRN

## 2012-06-30 MED ORDER — ONDANSETRON HCL 4 MG/2ML IJ SOLN: 4.0000 mg | Freq: Four times a day (QID) | INTRAMUSCULAR | Status: DC | PRN

## 2012-06-30 MED ORDER — FLEET ENEMA 7-19 GM/118ML RE ENEM: 1.0000 | ENEMA | RECTAL | Status: DC | PRN

## 2012-06-30 MED ORDER — CITRIC ACID-SODIUM CITRATE 334-500 MG/5ML PO SOLN: 30.0000 mL | ORAL | Status: DC | PRN

## 2012-06-30 MED ORDER — OXYTOCIN BOLUS FROM INFUSION
250.0000 mL | Freq: Once | INTRAVENOUS | Status: DC
  Filled 2012-06-30: qty 500

## 2012-06-30 MED ORDER — HYDROXYZINE HCL 50 MG PO TABS: 50.0000 mg | ORAL_TABLET | Freq: Four times a day (QID) | ORAL | Status: DC | PRN

## 2012-06-30 MED ORDER — LACTATED RINGERS IV SOLN: 500.0000 mL | INTRAVENOUS | Status: DC | PRN

## 2012-06-30 MED ORDER — IBUPROFEN 600 MG PO TABS: 600.0000 mg | ORAL_TABLET | Freq: Four times a day (QID) | ORAL | Status: DC | PRN

## 2012-06-30 MED ORDER — OXYCODONE-ACETAMINOPHEN 5-325 MG PO TABS: 1.0000 | ORAL_TABLET | ORAL | Status: DC | PRN

## 2012-06-30 NOTE — Plan of Care (Signed)
Problem: Consults Goal: Birthing Suites Patient Information Press F2 to bring up selections list   Pt > [redacted] weeks EGA     
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
## 2012-07-01 ENCOUNTER — Inpatient Hospital Stay (HOSPITAL_COMMUNITY): Payer: Medicaid Other | Admitting: Anesthesiology

## 2012-07-01 ENCOUNTER — Encounter (HOSPITAL_COMMUNITY): Payer: Self-pay | Admitting: Anesthesiology

## 2012-07-01 DIAGNOSIS — O480 Post-term pregnancy: Secondary | ICD-10-CM

## 2012-07-01 MED ORDER — LACTATED RINGERS IV SOLN: 500.0000 mL | Freq: Once | INTRAVENOUS | Status: DC

## 2012-07-01 MED ORDER — OXYCODONE-ACETAMINOPHEN 5-325 MG PO TABS
1.0000 | ORAL_TABLET | ORAL | Status: DC | PRN
  Administered 2012-07-02 – 2012-07-03 (×4): 1 via ORAL
  Filled 2012-07-01 (×4): qty 1

## 2012-07-01 MED ORDER — ONDANSETRON HCL 4 MG/2ML IJ SOLN: 4.0000 mg | INTRAMUSCULAR | Status: DC | PRN

## 2012-07-01 MED ORDER — ZOLPIDEM TARTRATE 5 MG PO TABS: 5.0000 mg | ORAL_TABLET | Freq: Every evening | ORAL | Status: DC | PRN

## 2012-07-01 MED ORDER — FENTANYL 2.5 MCG/ML BUPIVACAINE 1/10 % EPIDURAL INFUSION (WH - ANES)
14.0000 mL/h | INTRAMUSCULAR | Status: DC
  Administered 2012-07-01 (×3): 14 mL/h via EPIDURAL
  Filled 2012-07-01 (×3): qty 60

## 2012-07-01 MED ORDER — ERYTHROMYCIN 5 MG/GM OP OINT
TOPICAL_OINTMENT | OPHTHALMIC | Status: AC
  Filled 2012-07-01: qty 1

## 2012-07-01 MED ORDER — TETANUS-DIPHTH-ACELL PERTUSSIS 5-2.5-18.5 LF-MCG/0.5 IM SUSP: 0.5000 mL | Freq: Once | INTRAMUSCULAR | Status: DC

## 2012-07-01 MED ORDER — PRENATAL MULTIVITAMIN CH
1.0000 | ORAL_TABLET | Freq: Every day | ORAL | Status: DC
  Administered 2012-07-02 – 2012-07-03 (×2): 1 via ORAL
  Filled 2012-07-01 (×2): qty 1

## 2012-07-01 MED ORDER — DIPHENHYDRAMINE HCL 50 MG/ML IJ SOLN: 12.5000 mg | INTRAMUSCULAR | Status: DC | PRN

## 2012-07-01 MED ORDER — SIMETHICONE 80 MG PO CHEW: 80.0000 mg | CHEWABLE_TABLET | ORAL | Status: DC | PRN

## 2012-07-01 MED ORDER — PHENYLEPHRINE 40 MCG/ML (10ML) SYRINGE FOR IV PUSH (FOR BLOOD PRESSURE SUPPORT): 80.0000 ug | PREFILLED_SYRINGE | INTRAVENOUS | Status: DC | PRN

## 2012-07-01 MED ORDER — DIBUCAINE 1 % RE OINT: 1.0000 "application " | TOPICAL_OINTMENT | RECTAL | Status: DC | PRN

## 2012-07-01 MED ORDER — OXYTOCIN 10 UNIT/ML IJ SOLN
INTRAMUSCULAR | Status: AC
  Administered 2012-07-01: 10 [IU]
  Filled 2012-07-01: qty 1

## 2012-07-01 MED ORDER — EPHEDRINE 5 MG/ML INJ: 10.0000 mg | INTRAVENOUS | Status: DC | PRN

## 2012-07-01 MED ORDER — EPHEDRINE 5 MG/ML INJ
10.0000 mg | INTRAVENOUS | Status: DC | PRN
  Filled 2012-07-01: qty 4

## 2012-07-01 MED ORDER — LANOLIN HYDROUS EX OINT: TOPICAL_OINTMENT | CUTANEOUS | Status: DC | PRN

## 2012-07-01 MED ORDER — LIDOCAINE HCL (PF) 1 % IJ SOLN
INTRAMUSCULAR | Status: DC | PRN
  Administered 2012-07-01 (×4): 4 mL

## 2012-07-01 MED ORDER — WITCH HAZEL-GLYCERIN EX PADS: 1.0000 "application " | MEDICATED_PAD | CUTANEOUS | Status: DC | PRN

## 2012-07-01 MED ORDER — PHENYLEPHRINE 40 MCG/ML (10ML) SYRINGE FOR IV PUSH (FOR BLOOD PRESSURE SUPPORT)
80.0000 ug | PREFILLED_SYRINGE | INTRAVENOUS | Status: DC | PRN
  Filled 2012-07-01: qty 5

## 2012-07-01 MED ORDER — SENNOSIDES-DOCUSATE SODIUM 8.6-50 MG PO TABS
2.0000 | ORAL_TABLET | Freq: Every day | ORAL | Status: DC
  Administered 2012-07-02: 2 via ORAL

## 2012-07-01 MED ORDER — IBUPROFEN 600 MG PO TABS
600.0000 mg | ORAL_TABLET | Freq: Four times a day (QID) | ORAL | Status: DC
  Administered 2012-07-01 – 2012-07-03 (×6): 600 mg via ORAL
  Filled 2012-07-01 (×6): qty 1

## 2012-07-01 MED ORDER — DIPHENHYDRAMINE HCL 25 MG PO CAPS: 25.0000 mg | ORAL_CAPSULE | Freq: Four times a day (QID) | ORAL | Status: DC | PRN

## 2012-07-01 MED ORDER — BENZOCAINE-MENTHOL 20-0.5 % EX AERO
1.0000 "application " | INHALATION_SPRAY | CUTANEOUS | Status: DC | PRN
  Administered 2012-07-02: 1 via TOPICAL
  Filled 2012-07-01: qty 56

## 2012-07-01 MED ORDER — ONDANSETRON HCL 4 MG PO TABS: 4.0000 mg | ORAL_TABLET | ORAL | Status: DC | PRN

## 2012-07-01 NOTE — Progress Notes (Signed)
   [name] is a 26 y.o. G2P0010 at [redacted]w[redacted]d  admitted for induction of labor due to postdates.  Subjective: Contractions are about the same  Objective: BP 115/58  Pulse 82  Temp 97.9 F (36.6 C) (Oral)  Resp 20  Ht 5\' 4"  (1.626 m)  Wt 88.905 kg (196 lb)  BMI 33.64 kg/m2  LMP 09/17/2011    FHT:  FHR: 140 bpm, variability: moderate,  accelerations:  Present,  decelerations:  Absent UC:   regular, every 2-4 minutes SVE:   Dilation: 2 Effacement (%): 70 Station: -2 Exam by:: Cathie Beams, CNM Foley inserted into cervix and inflated with 60cc H20 Labs: Lab Results  Component Value Date   WBC 9.6 06/30/2012   HGB 11.7* 06/30/2012   HCT 35.7* 06/30/2012   MCV 86.0 06/30/2012   PLT 265 06/30/2012    Assessment / Plan: IOL for postdates, ripening stage; will start pitocin when foley falls out  Labor: ripening stage] Fetal Wellbeing:  Category I Pain Control:  Labor support without medications Anticipated MOD:  NSVD  CRESENZO-DISHMAN,Arlisa Leclere 07/01/2012, 6:20 AM

## 2012-07-01 NOTE — Progress Notes (Signed)
[name] is a 26 y.o. G2P0010 at 41w1 admitted for induction of labor due to Post dates. Due date 06/23/12.  Subjective: Pt breathing with contractions, coping well with pain using Fentanyl IV.  Family member in room for support.  Objective: BP 120/62  Pulse 92  Temp 98.4 F (36.9 C) (Oral)  Resp 18  Ht 5\' 4"  (1.626 m)  Wt 88.905 kg (196 lb)  BMI 33.64 kg/m2  LMP 09/17/2011      FHT:  FHR: 120 bpm, variability: moderate,  accelerations:  Present,  decelerations:  Absent UC:   regular, every 4 minutes SVE:   Dilation: 6 Effacement (%): 80 Station: -2 Exam by: [name], CNM Foley bulb out of cervix and in vagina, removed during exam BBOW   Labs: Lab Results  Component Value Date   WBC 9.6 06/30/2012   HGB 11.7* 06/30/2012   HCT 35.7* 06/30/2012   MCV 86.0 06/30/2012   PLT 265 06/30/2012    Assessment / Plan: Induction of labor due to postterm  Labor: S/P Foley bulb, Pt plans epidural at this time and is calling S/O to come to hospital Will reexamine in 2 hours and start Pitocin/AROM if needed Preeclampsia:  N/a Fetal Wellbeing:  Category I Pain Control:  Fentanyl I/D:  n/a Anticipated MOD:  NSVD  [name], [name] 07/01/2012, 9:56 AM

## 2012-07-01 NOTE — Progress Notes (Signed)
[name] is a 26 y.o. G2P0010 at [redacted]w[redacted]d admitted for induction of labor due to Post dates.  Subjective: Pt feeling rectal pressure with contractions and some mild constant pressure.  Family at bedside for support.  Objective: BP 117/58  Pulse 90  Temp 98.3 F (36.8 C) (Oral)  Resp 18  Ht 5\' 4"  (1.626 m)  Wt 88.905 kg (196 lb)  BMI 33.64 kg/m2  SpO2 99%  LMP 09/17/2011      FHT:  FHR: 115 bpm, variability: moderate,  accelerations:  Present,  decelerations:  Present early UC:   regular, every 3 minutes SVE:   Dilation: 7 Effacement (%): 80 Station: -2 Exam by:: Sharen Counter, CNM   Labs: Lab Results  Component Value Date   WBC 9.6 06/30/2012   HGB 11.7* 06/30/2012   HCT 35.7* 06/30/2012   MCV 86.0 06/30/2012   PLT 265 06/30/2012    Assessment / Plan: Induction of labor due to postterm S/P Foley bulb and SROM with meconium   Labor: Plan to start Pitocin at this time Preeclampsia:  N/A Fetal Wellbeing:  Category I Pain Control:  Epidural I/D:  n/a Anticipated MOD:  NSVD  LEFTWICH-KIRBY, Stacey Sago 07/01/2012, 12:09 PM

## 2012-07-01 NOTE — Anesthesia Procedure Notes (Signed)
Epidural Patient location during procedure: OB Start time: 07/01/2012 10:45 AM Reason for block: procedure for pain  Staffing Performed by: anesthesiologist   Preanesthetic Checklist Completed: patient identified, site marked, surgical consent, pre-op evaluation, timeout performed, IV checked, risks and benefits discussed and monitors and equipment checked  Epidural Patient position: sitting Prep: site prepped and draped and DuraPrep Patient monitoring: continuous pulse ox and blood pressure Approach: midline Injection technique: LOR air  Needle:  Needle type: Tuohy  Needle gauge: 17 G Needle length: 9 cm Needle insertion depth: 5 cm cm Catheter type: closed end flexible Catheter size: 19 Gauge Catheter at skin depth: 10 cm Test dose: negative  Assessment Events: blood not aspirated, injection not painful, no injection resistance, negative IV test and no paresthesia  Additional Notes Discussed risk of headache, infection, bleeding, nerve injury and failed or incomplete block.  Patient voices understanding and wishes to proceed.

## 2012-07-01 NOTE — Progress Notes (Signed)
   [name] is a 26 y.o. G2P0010 at [redacted]w[redacted]d  admitted for induction of labor due to postdates.  Subjective:  Contractions are getting uncomfortable, wanting pain meds Objective: BP 115/58  Pulse 82  Temp 97.9 F (36.6 C) (Oral)  Resp 20  Ht 5\' 4"  (1.626 m)  Wt 88.905 kg (196 lb)  BMI 33.64 kg/m2  LMP 09/17/2011    FHT:  FHR: 150 bpm, variability: moderate,  accelerations:  Present,  decelerations:  Absent UC:   regular, every 2-4 minutes SVE:   FT/50/-2  Labs: Lab Results  Component Value Date   WBC 9.6 06/30/2012   HGB 11.7* 06/30/2012   HCT 35.7* 06/30/2012   MCV 86.0 06/30/2012   PLT 265 06/30/2012    Assessment / Plan: IOL for postterm, possibly starting labor.  Will defer 2nd cytotec due to increasingly painful contractions  Labor: no Fetal Wellbeing:  Category I Pain Control:  Labor support without medications Anticipated MOD:  NSVD  CRESENZO-DISHMAN,Amerah Puleo 07/01/2012, 6:17 AM

## 2012-07-01 NOTE — Progress Notes (Signed)
Derra Ocanas is a 26 y.o. G2P0010 at [redacted]w[redacted]d admitted for induction of labor due to Post dates.   Subjective: Pt feeling rectal pressure with contractions.  Family at bedside for support.  Objective: BP 110/63  Pulse 83  Temp 98.5 F (36.9 C) (Oral)  Resp 18  Ht 5\' 4"  (1.626 m)  Wt 88.905 kg (196 lb)  BMI 33.64 kg/m2  SpO2 99%  LMP 09/17/2011   Total I/O In: 600 [Other:600] Out: -   FHT:  FHR: 125 bpm, variability: moderate,  accelerations:  Present,  decelerations:  Present Variables with ctx 1 hour ago, none at present UC:   regular, every 2-3 minutes SVE:   Dilation: 10 Effacement (%): 100 Station: +1 Exam by:: Sharen Counter, CNM  Labs: Lab Results  Component Value Date   WBC 9.6 06/30/2012   HGB 11.7* 06/30/2012   HCT 35.7* 06/30/2012   MCV 86.0 06/30/2012   PLT 265 06/30/2012    Assessment / Plan: Induction of labor due to postterm,  progressing well on pitocin  Labor: Progressing normally Preeclampsia:  N/A Fetal Wellbeing:  Category I Pain Control:  Epidural I/D:  n/a Anticipated MOD:  NSVD  LEFTWICH-KIRBY, LISA 07/01/2012, 5:18 PM
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
## 2012-07-02 ENCOUNTER — Encounter (HOSPITAL_COMMUNITY): Payer: Self-pay

## 2012-07-02 MED ORDER — RHO D IMMUNE GLOBULIN 1500 UNIT/2ML IJ SOLN
300.0000 ug | Freq: Once | INTRAMUSCULAR | Status: AC
  Administered 2012-07-02: 300 ug via INTRAMUSCULAR
  Filled 2012-07-02: qty 2

## 2012-07-02 NOTE — Progress Notes (Signed)
Post Partum Day 1 Subjective: no complaints, up ad lib, voiding and breastfeeding well.   Desires Mirena for family planning with no bridge.  Objective: Blood pressure 97/59, pulse 90, temperature 98.3 F (36.8 C), temperature source Oral, resp. rate 18, height 5\' 4"  (1.626 m), weight 196 lb (88.905 kg), last menstrual period 09/17/2011, SpO2 99.00%, unknown if currently breastfeeding.   Physical Exam:  Physical Exam:  General: alert, cooperative, appears stated age and no distress Lochia: appropriate Uterine Fundus: firm, 2 below umbilicus DVT Evaluation: No evidence of DVT seen on physical exam. Negative Homan's sign.   Basename 06/30/12 2025  HGB 11.7*  HCT 35.7*    Assessment/Plan: Plan for discharge tomorrow   LOS: 2 days   [facility] 07/02/2012, 10:15 AM

## 2012-07-03 LAB — RH IG WORKUP (INCLUDES ABO/RH)
ABO/RH(D): A NEG
Antibody Screen: NEGATIVE
Unit division: 0

## 2012-07-03 MED ORDER — IBUPROFEN 600 MG PO TABS: 600.0000 mg | ORAL_TABLET | Freq: Four times a day (QID) | ORAL | Status: DC

## 2012-07-03 NOTE — H&P (Signed)
Agree with above note.  [name] 07/03/2012 10:23 AM

## 2012-07-03 NOTE — Discharge Summary (Signed)
Attestation of Attending Supervision of Advanced Practitioner (CNM/NP): Evaluation and management procedures were performed by the Advanced Practitioner under my supervision and collaboration.  I have reviewed the Advanced Practitioner's note and chart, and I agree with the management and plan.  HARRAWAY-SMITH, Albina Gosney 7:57 AM     

## 2012-07-03 NOTE — Progress Notes (Signed)
UR chart review completed.  

## 2012-07-03 NOTE — Discharge Summary (Signed)
Obstetric Discharge Summary Reason for Admission: induction of labor Prenatal Procedures: NST and ultrasound Intrapartum Procedures: spontaneous vaginal delivery Postpartum Procedures: none Complications-Operative and Postpartum: none Hemoglobin  Date Value Range Status  06/30/2012 11.7* 12.0 - 15.0 g/dL Final     HCT  Date Value Range Status  06/30/2012 35.7* 36.0 - 46.0 % Final    Physical Exam:  General: alert, cooperative and appears stated age Lochia: appropriate Uterine Fundus: firm Incision: n/a DVT Evaluation: No evidence of DVT seen on physical exam. Negative Homan's sign.  Discharge Diagnoses: Term Pregnancy-delivered  Discharge Information: Date: 07/03/2012 Activity: unrestricted Diet: routine Medications: Ibuprofen Condition: stable Instructions: See info given. Discharge to: home Follow-up Information    Follow up with [facility] in 4 weeks.        Breastfeeding and desires Mirena  Newborn Data: Live born female  Birth Weight: 7 lb 0.4 oz (3185 g) APGAR: 2, 7  Home with mother.  [facility] 07/03/2012, 7:32 AM

## 2012-07-05 NOTE — Anesthesia Postprocedure Evaluation (Signed)
Patient stable following vaginal delivery.  

## 2012-07-11 ENCOUNTER — Telehealth: Payer: Self-pay

## 2012-07-11 ENCOUNTER — Other Ambulatory Visit: Payer: Self-pay | Admitting: Gynecology

## 2012-07-11 NOTE — Telephone Encounter (Signed)
Patient called and asked to have some all purpose nipple cream called in. Her lactation person told her to call us to get it. We called it in at [facility] which was the only place that makes it now.

## 2012-08-11 ENCOUNTER — Ambulatory Visit (INDEPENDENT_AMBULATORY_CARE_PROVIDER_SITE_OTHER): Payer: Medicaid Other | Admitting: Family Medicine

## 2012-08-11 ENCOUNTER — Encounter: Payer: Self-pay | Admitting: Family Medicine

## 2012-08-11 MED ORDER — FLUCONAZOLE 100 MG PO TABS: 100.0000 mg | ORAL_TABLET | Freq: Every day | ORAL | Status: DC

## 2012-08-11 NOTE — Patient Instructions (Signed)
Contraceptive Implant Information A contraceptive implant is a plastic rod that is inserted under the skin. It is usually inserted under the skin of your upper arm. It continually releases small amounts of progestin (synthetic progesterone) into the bloodstream. This prevents an egg from being released from the ovary. It also thickens the cervical mucus to prevent sperm from entering the cervix, and it thins the uterine lining to prevent a fertilized egg from attaching to the uterus. They can be effective for up to 3 years. Implants do not provide protection against sexually transmitted diseases (STDs).  The procedure to insert an implant usually takes about 10 minutes. There may be minor bruising, swelling, and discomfort at the insertion site for a couple days. The implant begins to work within the first day. Other contraceptive protection should be used for 2 weeks. Follow up with your caregiver to get rechecked as directed. Your caregiver will make sure you are a good candidate for the contraceptive implant. Discuss with your caregiver the possible side effects of the implant ADVANTAGES  It prevents pregnancy for up to 3 years.   It is easily reversible.   It is convenient.   The progestins may protect against uterine and ovarian cancer.   It can be used when breastfeeding.   It can be used by women who cannot take estrogen.  DISADVANTAGES  You may have irregular or unplanned vaginal bleeding.   You may develop side effects, including headache, weight gain, acne, breast tenderness, or mood changes.   You may have tissue or nerve damage after insertion (rare).   It may be difficult and uncomfortable to remove.   Certain medications may interfere with the effectiveness of the implants.  REMOVAL OF IMPLANT The implant should be removed in 3 years or as directed by your caregiver. The implants effect wears off in a few hours after removal. Your ability to get pregnant (fertility) is  restored within a couple of weeks. New implants can be inserted as soon as the old ones are removed if desired. DO NOT GET THE IMPLANT IF:   You are pregnant.   You have a history of breast cancer, osteoporosis, blood clots, heart disease, diabetes, high blood pressure, liver disease, tumors, or stroke.    You have undiagnosed vaginal bleeding.   You have overly sensitive to certain parts of the implant.  Document Released: 11/04/2011 Document Reviewed: 11/02/2011 [facility] Patient Information 2012 [place], [place].Breastfeeding Challenges Breastfeeding is often the best way to feed your baby. Challenges may discourage you from breastfeeding. But solutions can usually be found to help you. Some babies have conditions that may interfere with or make breastfeeding more difficult. But, in all of the following cases, breastfeeding is still best for a baby's health.  ADVANTAGES OF BREASTFEEDING  Breastfed babies tend to be healthier and less affected by disease.   Breastfed babies may have better brain development and be less likely to be overweight than formulafed babies.   Breastfeeding also benefits the mother. It will give you time to be close to your baby and helps create a strong bond. It also:   Delays the return of your periods.   Stimulates your uterus to contract back to normal.   Helps you lose some of the weight you gained during pregnancy.   Breastfeeding is also cheaper than formula feeding. It also does not require mixing formula, heating bottles, or washing extra dishes.   Breastfeeding mothers have a lower risk of developing breast cancer.   Breastfeeding  should be encouraged in women with gestational diabetes and diabetes type I and type II.  BREASTFEEDING CHALLENGES  Breastfeeding involves taking the time to get to know your baby's patterns and responding to his or her cues. Once breastfeeding is well established, feedings usually become more regular and more widely  spaced. Some mothers do not nurse their babies because they come across problems early on. If at all possible, begin breastfeeding your baby within an hour after delivery. The first milk you produce is called colostrum. It is packed with nutrients and disease-fighting substances. These will help nourish and protect your baby against infections as he or she grows up.   Some babies are unable to breastfeed because of premature birth and small size along with weakness and difficulty sucking. Sometimes with birth defects of the mouth (cleft lip or cleft palate) the mother may be able to pump breastmilk to give to her baby. Some digestive problems (breast milk jaundice, galactosemia) may be reasons not to nurse. See a lactation consultant if you have a breast infection or breast abscess, breast cancer or other cancer, previous surgery or radiation treatment, or inadequate milk supply (uncommon).  SOME MOTHERS ARE ADVISED NOT TO BREASTFEED DUE TO HEALTH PROBLEMS SUCH AS:  Serious illnesses.   Severe malnutrition.   Undergoing radiation therapy.   Taking psychiatric medication.   Active herpes lesions on the breast.   Chickenpox or shingles.   Active, untreated tuberculosis.   HIV (human immunodeficiency virus) infection.   Drug or alcohol addiction.   Undergoing radioactive iodine therapy.   Leukemia human cell Type I or II.  BREASTFEEDING SHOULD NOT BE PAINFUL  It is natural for minor problems to arise in first time breastfeeding. Problems you may have and some solutions are as follows:   Nipple soreness may be caused by:   Improper position of baby.   Improper feeding techniques.   Improper nipple care.   For many women, there is no identified cause. A simple change in your baby's position while feeding may relieve nipple soreness. Some breastfeeding mothers report nipple soreness only during the initial adjustment period.   If there is tenderness at first, it should gradually go  away as the days go by. Poor latch-on and positioning are common causes of sore nipples. This is usually because the baby is not getting enough of the areola into his or her mouth, and is sucking mostly on the nipple. The areola is the colored portion around the nipple. In general, nurse early and often. Nurse with the nipple and areola in the baby's mouth, not just the nipple. And feed your baby on demand.   If you have sore nipples and put off feedings because of the pain, this can lead to your breasts becoming overly full. This may lead to plugged milk ducts in the breast followed by engorgement or even infection of the breasts. If your baby is latched on correctly, he or she should be able to nurse as long as needed without causing any pain. If it hurts, take the baby off of your breast and try again. Ask for help if it is still painful for you.   Check the positioning of your baby's body and the way she latches on and sucks. To minimize soreness, your baby's mouth should be open wide with as much of the areola in his or her mouth as possible. You should find that it feels better right away once the baby is positioned correctly.   Do  not delay feedings. Try to relax so your let-down reflex comes easily. You also can hand-express a little milk before beginning the feeding so your baby does not clamp down harder, waiting for the milk to come.   If your nipples are very sore, it may be helpful to change positions each time you nurse. This puts the pressure on a different part of the nipple.   After nursing, you can also express a few drops of milk and gently rub it on your nipples. Human milk has natural healing properties. Let your nipples air-dry after feeding, or wear a soft-cotton shirt.   Wearing a nipple shield during nursing will not relieve sore nipples. They actually can prolong soreness by making it hard for the baby to learn to nurse without the shield.   Avoid wearing bras or clothes that  are too tight and put pressure on your nipples. If you wear a bra, get one that offers good support to your breasts.   Change nursing pads often to avoid trapping in moisture. Only use cotton pads.   Avoid using soap, ointments or other chemicals on your nipples. Make sure to avoid products that must be removed before nursing. Washing with clean water is all that is necessary to keep your nipples and breasts clean.   Try rubbing pure lanolin on your nipples after breastfeeding to soothe the pain.   Making sure you get enough rest, eating healthy foods, and getting enough fluids also can help the healing process. If you have very sore nipples, you can ask your caregiver about using non-aspirin pain relievers.   Another cause of sore nursing is a condition called thrush. This is a fungal infection that can form on your nipples from the milk. Other signs of thrush include itching, flaking and drying skin, tender or pink skin. The infection also can form in the baby's mouth from having contact with your nipples. There it appears as little white spots on the inside of the cheeks, gums, or tongue. It also can appear as a diaper rash on your baby that will not go away by using regular diaper rash ointments. If you have any of these symptoms or think you have thrush, contact your doctor and your baby's doctor, or a Advertising copywriter. A lactation consultant is a breastfeeding Risk analyst. You can get medication for your nipples and for your baby.   If you still have sore nipples after following the above tips, you may need to see someone who is trained in breastfeeding, like a Advertising copywriter.  ENGORGEMENT Engorgement is a condition after pregnancy, when your breasts feel very hard and painful. You also may have breast swelling, tenderness, warmth, redness, throbbing and flattening of the nipple. Engorgement may cause a low-grade fever. This can be confused with a breast infection. Engorgement  is the result of the milk building up. It usually happens during the third to fifth day after birth. This slows circulation. When blood and lymph move through the breasts, fluid from the blood vessels can seep into the breast tissues. All of the following can cause engorgement.  Poor positioning.   Infrequent feedings.   Giving supplementary bottles of water, juice, formula, or breast milk or using a pacifier. All of these cut down on your feeding and may lead to engorgement.   Changing the breastfeeding schedule with decreasing in feeding.   The baby changes the nursing pattern.   Having a baby with a weak suck who is not able  to nurse effectively.   Fatigue, stress, or anemia in the mother.   An overabundant milk supply.   Nipple damage.   Breast abnormalities.  Engorgement can lead to plugged ducts or a breast infection. So it is important to try to prevent it before this happens. If treated properly, engorgement should only last for one to two days.  Minimize engorgement by making sure the baby is latched on and positioned correctly at your breast.   Nurse frequently after birth. Allow the baby to nurse as long as he/she likes, as long as he/she is latched on well and sucking effectively.   In the early days when your milk is coming in, you should awaken a sleepy baby every 2 to 3 hours to breastfeed. Breastfeeding often on the affected side helps to remove the milk and keeps milk moving freely. This prevents overfilling of the breast.   Avoid additional bottles and pacifiers.   Try hand expressing or pumping a little milk to first soften the breast, areola, and nipple before breastfeeding. Or massage the breast before feeding.   Cold compresses in between feedings can help ease pain and swelling.   If you are returning to work, try to pump your milk on the same schedule your baby was fed.   Eat a well balanced diet and drink plenty of fluids.   Use a well-fitting,  supportive bra that is not too tight.   If your engorgement lasts for more than two days even after treating it, contact a Advertising copywriter.   Use a breast pump to keep up with your nursing schedule.   Use a breast pump if your baby is not taking enough milk or you feel you may be getting engorged.  PLUGGED DUCTS AND INFECTION Plugged ducts and breast infection (mastitis) are common sources of sore breasts postpartum. It is common for many women to have a plugged duct in the breast at some point if breastfeeding.   A plugged milk duct feels like a tender, sore, lump in the breast. It is not accompanied by a fever or other symptoms. It happens when a milk duct does not properly drain. Then, pressure builds up behind the plug, and surrounding tissue becomes inflamed. A plugged duct usually only occurs in one breast at a time.   A breast infection (mastitis), on the other hand, is soreness or a lump in the breast that can be accompanied by a fever and/or flu-like symptoms. You may feel run down or very achy. Some women with a breast infection also have nausea and vomiting. You also may have yellowish discharge from the nipple that looks like colostrum. Or the breasts feel warm or hot to the touch and appear pink or red. A breast infection can occur when other family members have a cold or the flu. Like a plugged duct, it usually only occurs in one breast. It is not always easy to tell the difference between a breast infection and a plugged duct. Both have similar symptoms and can improve within 24 to 48 hours.   Treatment for plugged ducts and breast infections is similar. But some breast infections need to also be treated with an antibiotic.   If mastitis is not treated quickly, it may lead to a breast abscess.   It may help to massage the area, starting behind the sore spot. Use your fingers in a circular motion and massage toward the nipple.   Breastfeed more often on the affected side. This  helps  loosen the plug, keeps the milk moving freely, and the breast from becoming overly full. Nursing every two hours, both day and night on the affected side first can be helpful.   Getting extra sleep or relaxing with your feet up can help speed healing. Often a plugged duct or breast infection is the first sign that a mother is doing too much and becoming overly tired.   Wear a well-fitting supportive bra that is not too tight, since this can constrict milk ducts.   If you do not feel better within 24 hours of trying these steps, and you have a fever or your symptoms worsen, call your doctor. You may need an antibiotic. Also, if you have a breast infection in which both breasts look affected, or there is pus or blood in the milk, red streaks near the area, or your symptoms came on severely and suddenly, see your doctor right away.   Even if you need an antibiotic, continuing to breastfeed during treatment is best for both you and your baby. Most antibiotics will not affect your baby through your breast milk.  THRUSH Thrush (yeast) is a fungal infection that can form on your nipples or in your breast because it thrives on milk. The infection forms from an overgrowth of the candida organism. Candida usually exists in our bodies and is kept at healthy levels by the natural bacteria in our bodies. But, when the natural balance of bacteria is upset, candida can overgrow, causing an infection. Some of the things that can cause thrush include:   Having an overly moist environment on your skin or nipples that are sore or cracked.   Taking antibiotics, birth control pills or steroids.   Having a diet that contains large amounts of sugar or foods with yeast.   Having a chronic illness like HIV infection, diabetes, or anemia.  If you have sore nipples that last more than a few days even after you make sure your baby's latch and positioning is correct, or you suddenly get sore nipples after several weeks  of unpainful nursing, you could have thrush. Some other signs of thrush include:   Pink, flaky, shiny, itchy or cracked nipples.   Deep pink and blistered nipples.   You also could have shooting pains deep in the breast during or after feedings, or achy breasts.  The infection also can form in your baby's mouth from having contact with your nipples, and appear as little white spots on the inside of the cheeks, gums, or tongue. It also can appear as a diaper rash (small red dots around a rash) on your baby that will not go away by using regular diaper rash ointments. Many babies with thrush refuse to nurse, or are gassy or cranky.  Solution:   If you or your baby have any of these symptoms, contact your doctor and your baby's doctor so you both can be correctly diagnosed.   You can get medication for your nipples and for your baby. Medication for a mother is usually an ointment for the nipples. Your baby can be given a liquid medication for his/her mouth, and/or an ointment for any diaper rash.   Change disposable nursing pads often. Wash any towels or clothing that come in contact with the yeast in very hot water (above 122 F or 50 C).   Wear a clean bra every day. Wash your hands often, and wash your baby's hands often, especially if he or she sucks on his/her fingers.  Only use cotton pads.   Boil any pacifiers, bottle nipples, or toys your baby puts in his or her mouth once a day for 20 minutes to kill the thrush. After one week of treatment, discard pacifiers and nipples and buy new ones.   Boil daily for 20 minutes all breast pump parts that touch the milk.   Make sure other family members are free of thrush or other fungal infections. If they have symptoms, get them treatment.  NURSING STRIKE A nursing strike is when your baby has been nursing well for months, then suddenly loses interest in breastfeeding and begins to refuse the breast. A nursing strike can mean several things are  happening with your baby and that she or he is trying to communicate with you to let you know that something is wrong. Not all babies will react the same to different situations that can cause a nursing strike. Some will continue to breastfeed without a problem, others may just become fussy at the breast, and others will refuse the breast entirely. Some of the major causes of a nursing strike include:  Mouth pain from teething, a fungal infection, or a cold sore.   An ear infection.   Pain from a certain nursing position.   Being upset about a long separation from the mother or a major change in routine.   Being interested in other things around him or her.   A cold or stuffy nose that makes breathing difficult.   Reduced milk supply from supplementing with bottles or overuse of a pacifier.   Responding to the mother's strong reaction if the baby has bitten her.   Being upset about hearing arguing or people talking in a harsh voice with other family members while nursing.   Reacting to stress, over-stimulation, or having been repeatedly put off when wanting to nurse.   If your baby is on a nursing strike, it is normal to feel frustrated and upset, especially if your baby is unhappy. It is important not to feel guilty or that you have done something wrong. Your breasts also may become uncomfortable as the milk builds up.  Treatment  Try to express your milk manually or with a breast pump on the same schedule as the baby used to breastfeed to avoid engorgement and plugged ducts.   Try another feeding method temporarily to give your baby your milk, such as a cup, dropper, or spoon. Keep track of your baby's wet diapers to make sure he/she is getting enough milk (5 to 6 per day).   Keep offering your breast to the baby. If the baby is frustrated, stop and try again later. Try when the baby is sleeping or very sleepy.   Try various breastfeeding positions.   Focus on the baby with all of  your attention and comfort him or her with extra touching and cuddling.   Try nursing while rocking and in a quiet room free of distractions.  INVERTED OR LARGE NIPPLES Some women have nipples that naturally are inverted, or that turn inward instead of protruding, or that are flat and do not protrude. Inverted or flat nipples can sometimes make it harder to breastfeed because your baby can have a harder time latching on. But remember that for breastfeeding to work, your baby has to latch on to both the nipple and the breast, so even inverted nipples can work just fine. Very large nipples can make it hard for the baby to get enough of the areola  into his or her mouth to compress the milk ducts and get enough milk.  Know what type of nipples you have before you have your baby, so you can be prepared in case you have a problem getting your baby to latch on correctly.   Talk with a lactation consultant at the hospital or at a breastfeeding clinic for extra help if you have flat, inverted, or very large nipples.   Sometimes a Advertising copywriter can help inverted nipples to be pulled out with a small device before your baby is brought to your breast.   In many cases, inverted nipples will protrude more as the baby starts to latch on and as time passes. The baby's sucking will help.   Flat nipples cause fewer problems than inverted nipples. Good latch-on and positioning are usually enough to ensure that a baby latched to a flat nipple breastfeeds well.   The latch for babies of mothers with very large nipples will improve with time as the baby grows. In some cases, it might take several weeks to get the baby to latch well. A good milk supply helps insure that her baby will get enough milk.  RETURNING TO WORK More and more women are breastfeeding when they return to work because they believe in the benefits of breastfeeding. You can purchase or rent effective breast pumps and storage containers for your  milk. Many employers are willing to set up special rooms for mothers who pump. But others are not as educated about the benefits of breastfeeding. Also, many women are not able to take off as much time as they'd like after having their babies and might have to return to work before breastfeeding is well established. After you have your baby, take as much time off as possible. This will help you get your breastfeeding established well and may reduce the number of months you may need to pump your milk while you are at work.   If you plan to have your baby take a bottle of expressed breast milk while you are at work, you can introduce your baby to a bottle when he or she is around four weeks old. Otherwise, the baby might not accept the bottle later on. Once your baby is comfortable taking a bottle, it is a good idea to have Dad or another family member offer a bottle of pumped breast milk on a regular basis so the baby stays in practice.   Let your employer or Geophysical data processor know that you plan to continue breastfeeding once you return to work. Before you return to work, or even before you have your baby, start talking with your employer about breastfeeding. Do not be afraid to request a clean and private area where you can pump your milk. If you do not have your own office space, you can ask to use a supervisor's office during certain times. Or you can ask to have a clean, clutter free corner of a storage room. All you need is a chair, a small table, and an outlet if you are using an electric pump. Many electric pumps also can run on batteries and do not require an outlet. You can lock the door and place a small sign on it that asks for some privacy. You can pump your breast milk during lunch or other breaks. You could suggest to your employer that you are willing to make up work time for time spent pumping milk.   After pumping, you can refrigerate your  milk, place it in a cooler, or freeze it for  the baby to be fed later. Many breast pumps come with carrying cases that have a section to store your milk with ice packs. If you do not have access to a refrigerator, you can leave it at room temperatures for up to 6 hours.   Many employers are not aware of state laws that state they have to allow you to breastfeed at your job. Under these laws, your employer is required to set up a space for you to breastfeed and/or allow paid/unpaid time for breastfeeding employees. To see if your state has a breastfeeding law for employers, go to https://www.warren.info/ or call us at 1-800-LALECHE (in [place]).  JAUNDICE  Jaundice is a condition that is common in many newborns. It appears as a yellowing of the skin and eyes. It is caused by an excess of bilirubin, a yellow pigment that is a product in the blood. All babies are born with extra red blood cells that undergo a process of being broken down and eliminated from the body. Bilirubin levels in the blood can be high because of:  Higher production of it in a newborn.   Increased ability of the newborn intestine to absorb it.   Limited ability of the newborn liver to handle large amounts of it.  Many cases of jaundice do not need to be treated. Your baby's doctor will carefully monitor your baby's bilirubin levels. Sometimes infants have to be temporarily separated from their mothers to receive special treatment with phototherapy (aiming lights on the baby). In these cases, breastfeeding is encouraged but supplements may also be given to the baby. American Academy of Pediatrics advises against stopping breastfeeding in jaundiced babies and suggests continuing frequent breastfeeding, even during treatment. If your baby is jaundiced or develops jaundice, it is important to discuss with your baby's caregiver all possible treatment options. Share that you do not want to interrupt nursing if this is at all possible.  REFLUX  It is not unusual for babies to  spit up after nursing. Usually, babies can spit up and show no other signs of illness. The spitting up disappears as the baby's digestive system matures. As long as the baby has 6 to 8 wet diapers and at least 2 bowel movements in a 24 hour period (under 45 weeks of age), and your baby is gaining weight (at least 4 ounces a week) you can be assured your baby is getting enough milk.  However, some babies have a condition called gastroesophageal reflux (GER). This happens when the muscle at the opening of the stomach opens at the wrong times, allowing milk and food to come back up into the the tube in the throat (esophagus). Symptoms of GER can include:   Crying as if in discomfort.   Waking up frequently at night.   Problems swallowing.   Frequent red or sore throat.   Signs of asthma, bronchitis, wheezing or problems breathing.   Projectile vomiting.   Arching of the back as if in severe pain.   Slow weight gain.   Gagging or choking.   Frequent hiccupping or burping.   Severe spitting up, or spitting up after every feeding, or hours after eating.   Refusal to eat.  Many healthy babies might have some of these symptoms and do not have GER. But there are babies who might only have a few of these symptoms and have a severe case of GER. Not all babies with GER  spit up or vomit.  Some babies with GER do not have a serious medical problem. But caring for them can be hard since they tend to be very fussy and wake up frequently at night. More severe cases of GER may need to be treated with medication if the baby, in addition to spitting up, also refuses to nurse, gains weight poorly or is losing weight, or has periods of gagging or choking.   If your baby spits up after every feeding and has any of the other symptoms mentioned above, it is best to see his or her doctor for a correct diagnosis. Other than GER, your baby could have another condition that needs treatment. If there are no other  signs of illness, he/she could just be sensitive to a food in your diet or a medication he/she is receiving. If your baby has GER, it is important to try to continue to breastfeed since breast milk still is more easily digested than formula. Try smaller, more frequent feedings, thorough burping, and putting the baby in an upright position during and after feedings.  CLEFT PALATE AND CLEFT LIP  Cleft palate and cleft lip are some of the most common birth defects that happen as a baby is developing in the womb. A cleft, or opening, in either the palate or lip can happen together or separately and both can be corrected through surgery. Both conditions can prevent babies from breastfeeding because a baby cannot form a good seal around the nipple and areola with his or her mouth, or get milk out of the breast well.   Cleft palate can happen on one or both sides of a baby's mouth and be partial or complete. Right after birth, a mother whose baby has a cleft palate can try to breastfeed her baby, and she can start expressing her milk right away to keep up her supply. Even if her baby cannot latch on well to her breast, the baby can be fed breast milk by cup. In some hospitals, babies with cleft palate are fitted with a mouthpiece called an obturator. This fits into the cleft and seals it for easier feeding. The baby should be able to exclusively breastfeed after surgery.   Cleft lip can happen on one or both sides of a baby's lip. But a mother can try different breastfeeding positions and use her thumb or breast to help fill in the opening left by the lip to form a seal around the breast. With cleft lip repair, breastfeeding may only have to be stopped for a few hours.   If your baby is born with a cleft palate or cleft lip, talk with a lactation consultant in the hospital for assistance as soon as possible. Human milk and early breastfeeding is still best for your baby's health.  TWINS OR MULTIPLES Mothers of  twins or multiples might feel overwhelmed with the idea of breastfeeding more than one baby at a time. The benefits of human milk to both these mothers and babies are the same as for all mothers and babies. When breastfeeding twins, your breast milk will increase to the amount the babies will need. You will have to take in more calories and liquids when nursing twins. If the babies are premature and unable to nurse, you can pump your breasts and freeze the milk until the babies are ready to nurse. But mothers of multiples get even more benefits from breastfeeding:  Their uterus contracts, which is helpful because it has stretched even  more to hold more than one baby.   Hormones are released that relax the mother, which is helpful with the added stress of caring for more than one baby.   Eight to ten hours per week are saved because there is no need to prepare formula or bottles and the mother's milk is available right away.   Breastfeeding early and often for a mother of multiples is important to keep up her milk supply. A good latch-on for each baby is important to avoid sore nipples. Many mothers find that it is easier to nurse the babies together rather than separately, and that it gets easier as the babies get older. There are many breastfeeding holds that make it easier to nurse more than one baby at a time. If you are having multiples, talk with a lactation consultant about more ways you can successfully breastfeed your babies.  BREASTFEEDING DURING PREGNANCY While most mothers who are nursing a toddler stop breastfeeding if they find out they are pregnant, it is an individual choice to decide whether to keep breastfeeding during the pregnancy. It is not unsafe for the unborn child if you continue to breastfeed an older child during this time. But, if you are having some problems in your pregnancy such as uterine pain or bleeding, a history of preterm labor or problems gaining weight during  pregnancy, your doctor may advise you to wean. Your child also may decide to wean on his or her own because pregnancy changes the amount and flavor of your milk. Some women also choose to wean at this time because they have nipple soreness caused by pregnancy hormones, are nauseous, tire more easily, or find that their growing stomachs make breastfeeding uncomfortable. You will need more calories when you breastfeed while pregnant. Your milk production usually slows down around the fourth month of pregnancy.  BREASTFEEDING AFTER BREAST SURGERY   If you have had breast surgery, including breast implants, you might be worried about whether you will be able to breastfeed. The most important things that affect whether you can produce enough milk for your baby are how your surgery was done and where your incisions are, and the reasons for your surgery. For example, women who have had incisions in the fold under the breasts are less likely to have problems producing milk than women who have had incisions around or across the areola. Incisions around the areola can cut into milk ducts and nerves, where milk is produced and travels. Women who have had breast surgery to augment breasts that never fully developed may not have enough glands to produce a full milk supply.   If you had breast surgery and are worried about how it will affect breastfeeding, talk with a Advertising copywriter. If you are planning breast surgery and are worried about how it will affect breastfeeding, talk with your surgeon about ways he or she can preserve as much of the breast tissue and milk ducts as possible.  INDUCING LACTATION  Many mothers who adopt want to breastfeed their babies and can do it successfully with some help. It is successful ? to  of the time it is tried. Many will need to supplement their breast milk with donated breast milk or infant formula. But some adoptive mothers can breastfeed exclusively, especially if they have  been pregnant before. Lactation is a hormonal response to a physical action. So the stimulation of the baby nursing causes the body to see a need for and produce milk. The more the  baby nurses, the more a woman's body will produce milk.   Beginning a combination of hormone treatment several months before the baby is born should help facilitate lactation in many cases.   One thing you can do to prepare is to pump every 3 hours around the clock for two to three weeks before your baby arrives. Or you can wait until the baby arrives and starts to nurse. Breast milk can be frozen until you are ready to nurse the baby. A supplemental nursing system (SNS) or a lactation aid can help ensure that your baby gets enough nutrition and that your breasts are stimulated to produce milk at the same time.   If you are an adoptive mother who wants to breastfeed, you should see both a Advertising copywriter and your doctor for help in establishing an initial milk supply.  FOR MORE INFORMATION [employer]: www.llli.org Document Released: 05/09/2006 Document Revised: 11/04/2011 Document Reviewed: 07/31/2008 [facility] Patient Information 2012 [place], [place].

## 2012-08-11 NOTE — Progress Notes (Signed)
  Subjective:     [name] is a 26 y.o. female who presents for a postpartum visit. She is 6 weeks postpartum following a spontaneous vaginal delivery. I have fully reviewed the prenatal and intrapartum course. The delivery was at 41 gestational weeks. Outcome: spontaneous vaginal delivery. Anesthesia: epidural. Postpartum course has been normal. Baby's course has been normal. Baby is feeding by breast. Bleeding no bleeding. Bowel function is normal. Bladder function is normal. Patient is not sexually active. Contraception method is none. Postpartum depression screening: negative.  The following portions of the patient's history were reviewed and updated as appropriate: allergies, current medications, past family history, past medical history, past social history, past surgical history and problem list.  Review of Systems A comprehensive review of systems was negative.   Objective:    BP 124/79  Pulse 74  Ht 5\' 4"  (1.626 m)  Wt 175 lb (79.379 kg)  BMI 30.04 kg/m2  Breastfeeding? Yes  General:  alert, cooperative and appears stated age     Lungs: clear to auscultation bilaterally  Heart:  regular rate and rhythm, S1, S2 normal, no murmur, click, rub or gallop  Abdomen: soft, non-tender; bowel sounds normal; no masses,  no organomegaly   Vulva:  normal  Vagina: normal vagina  Cervix:  multiparous appearance  Corpus: normal size, contour, position, consistency, mobility, non-tender  Adnexa:  normal adnexa           Assessment:    6 wk postpartum exam. Pap smear not done at today's visit.   Plan:    1. Contraception: Nexplanon 3. Follow up in: 4 months or as needed.

## 2012-08-13 ENCOUNTER — Emergency Department (HOSPITAL_COMMUNITY)
Admission: EM | Admit: 2012-08-13 | Discharge: 2012-08-14 | Disposition: A | Discharge status: Home / Self Care | Payer: Medicaid Other | Attending: Emergency Medicine | Admitting: Emergency Medicine

## 2012-08-13 ENCOUNTER — Emergency Department (HOSPITAL_COMMUNITY): Payer: Medicaid Other

## 2012-08-13 DIAGNOSIS — Z87891 Personal history of nicotine dependence: Secondary | ICD-10-CM | POA: Insufficient documentation

## 2012-08-13 DIAGNOSIS — H543 Unqualified visual loss, both eyes: Secondary | ICD-10-CM

## 2012-08-13 DIAGNOSIS — F341 Dysthymic disorder: Secondary | ICD-10-CM | POA: Insufficient documentation

## 2012-08-13 LAB — POCT I-STAT, CHEM 8
BUN: 8 mg/dL (ref 6–23)
Hemoglobin: 13.9 g/dL (ref 12.0–15.0)
Potassium: 3.3 mEq/L — ABNORMAL LOW (ref 3.5–5.1)
Sodium: 139 mEq/L (ref 135–145)
TCO2: 25 mmol/L (ref 0–100)

## 2012-08-13 LAB — CBC
MCH: 28.3 pg (ref 26.0–34.0)
MCV: 84.3 fL (ref 78.0–100.0)
Platelets: 358 10*3/uL (ref 150–400)
RBC: 4.66 MIL/uL (ref 3.87–5.11)
RDW: 14.1 % (ref 11.5–15.5)
WBC: 8.3 10*3/uL (ref 4.0–10.5)

## 2012-08-13 MED ORDER — OXYCODONE-ACETAMINOPHEN 5-325 MG PO TABS
1.0000 | ORAL_TABLET | Freq: Once | ORAL | Status: AC
  Administered 2012-08-13: 1 via ORAL
  Filled 2012-08-13: qty 1

## 2012-08-13 MED ORDER — IBUPROFEN 200 MG PO TABS
600.0000 mg | ORAL_TABLET | Freq: Once | ORAL | Status: AC
  Administered 2012-08-13: 600 mg via ORAL
  Filled 2012-08-13: qty 3

## 2012-08-13 NOTE — ED Notes (Addendum)
Last several weeks having h/a's and visual changes; h/a's located in between the eyes of front of head and occipital region; no neurological deficits. S/s last 5 minutes.

## 2012-08-13 NOTE — Progress Notes (Signed)
Neurology was paged for  A question about what kind of MRI would be needed for a patient with occipital headaches. No OFFICIAL CONSULT WAS PLACED BY THE NP
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
## 2012-08-13 NOTE — ED Notes (Addendum)
Pt states, "I sometimes go blind in both eyes and it's just black. I can't see anything.  But my head will do this weird thing before it goes black.  Like when you stand up too fast and it goes black.  Normally when it happens I have a headache and I can't see for about five minutes."  Pt reports symptoms have been persistent over the past two weeks.  She states she started taking diflucan on Friday and takes prenatal vitamins.   Pt denies drug or alcohol use.

## 2012-08-14 ENCOUNTER — Emergency Department (HOSPITAL_COMMUNITY): Payer: Medicaid Other

## 2012-08-14 ENCOUNTER — Encounter: Payer: Medicaid Other | Admitting: Obstetrics and Gynecology

## 2012-08-14 ENCOUNTER — Encounter (HOSPITAL_COMMUNITY): Payer: Self-pay | Admitting: Emergency Medicine

## 2012-08-14 MED ORDER — GADOBENATE DIMEGLUMINE 529 MG/ML IV SOLN
15.0000 mL | Freq: Once | INTRAVENOUS | Status: AC
  Administered 2012-08-14: 15 mL via INTRAVENOUS

## 2012-08-14 MED ORDER — IBUPROFEN 800 MG PO TABS
800.0000 mg | ORAL_TABLET | Freq: Once | ORAL | Status: AC
  Administered 2012-08-14: 800 mg via ORAL
  Filled 2012-08-14: qty 1

## 2012-08-14 NOTE — ED Notes (Signed)
Received bedside report from [place], Charity fundraiser.  Patient currently sitting up in bed; no respiratory or acute distress noted.  Patient updated on plan of care; patient currently pending MRI in the morning.  Patient has no other questions or concerns at this time; will continue to monitor.

## 2012-08-14 NOTE — ED Notes (Signed)
Patient currently asleep in bed; no respiratory or acute distress noted.  Family present at bedside; denies needs at this time; will continue to monitor.
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
## 2012-08-14 NOTE — ED Notes (Signed)
Patient currently resting quietly in bed; no respiratory or acute distress noted.  Family present at bedside; denies needs at this time; will continue to monitor.  Patient pending for CT scan in the morning.

## 2012-08-15 ENCOUNTER — Encounter: Payer: Self-pay | Admitting: Nurse Practitioner

## 2012-08-15 ENCOUNTER — Ambulatory Visit (INDEPENDENT_AMBULATORY_CARE_PROVIDER_SITE_OTHER): Payer: Medicaid Other | Admitting: Nurse Practitioner

## 2012-08-15 VITALS — BP 113/70 | HR 93 | Ht 64.0 in | Wt 175.0 lb

## 2012-08-15 DIAGNOSIS — Z30017 Encounter for initial prescription of implantable subdermal contraceptive: Secondary | ICD-10-CM

## 2012-08-15 DIAGNOSIS — Z309 Encounter for contraceptive management, unspecified: Secondary | ICD-10-CM

## 2012-08-15 DIAGNOSIS — G43109 Migraine with aura, not intractable, without status migrainosus: Secondary | ICD-10-CM

## 2012-08-15 MED ORDER — SUMATRIPTAN SUCCINATE 100 MG PO TABS: 100.0000 mg | ORAL_TABLET | Freq: Once | ORAL | Status: DC | PRN

## 2012-08-15 MED ORDER — CYCLOBENZAPRINE HCL 10 MG PO TABS: 10.0000 mg | ORAL_TABLET | Freq: Three times a day (TID) | ORAL | Status: DC | PRN

## 2012-08-15 MED ORDER — ETONOGESTREL 68 MG ~~LOC~~ IMPL: 1.0000 | DRUG_IMPLANT | Freq: Once | SUBCUTANEOUS | Status: DC

## 2012-08-15 NOTE — Patient Instructions (Addendum)
Migraine Headache A migraine headache is an intense, throbbing pain on one or both sides of your head. The exact cause of a migraine headache is not always known. A migraine may be caused when nerves in the brain become irritated and release chemicals that cause swelling within blood vessels, causing pain. Many migraine sufferers have a family history of migraines. Before you get a migraine you may or may not get an aura. An aura is a group of symptoms that can predict the beginning of a migraine. An aura may include:  Visual changes such as:   Flashing lights.   Bright spots or zig-zag lines.   Tunnel vision.   Feelings of numbness.   Trouble talking.   Muscle weakness.  SYMPTOMS  Pain on one or both sides of your head.   Pain that is pulsating or throbbing in nature.   Pain that is severe enough to prevent daily activities.   Pain that is aggravated by any daily physical activity.   Nausea (feeling sick to your stomach), vomiting, or both.   Pain with exposure to bright lights, loud noises, or activity.   General sensitivity to bright lights or loud noises.  MIGRAINE TRIGGERS Examples of triggers of migraine headaches include:   Alcohol.   Smoking.   Stress.   It may be related to menses (female menstruation).   Aged cheeses.   Foods or drinks that contain nitrates, glutamate, aspartame, or tyramine.   Lack of sleep.   Chocolate.   Caffeine.   Hunger.   Medications such as nitroglycerine (used to treat chest pain), birth control pills, estrogen, and some blood pressure medications.  DIAGNOSIS  A migraine headache is often diagnosed based on:  Symptoms.   Physical examination.   A computerized X-ray scan (computed tomography, CT) of your head.  TREATMENT  Medications can help prevent migraines if they are recurrent or should they become recurrent. Your caregiver can help you with a medication or treatment program that will be helpful to you.   Lying  down in a dark, quiet room may be helpful.   Keeping a headache diary may help you find a trend as to what may be triggering your headaches.  SEEK IMMEDIATE MEDICAL CARE IF:   You have confusion, personality changes or seizures.   You have headaches that wake you from sleep.   You have an increased frequency in your headaches.   You have a stiff neck.   You have a loss of vision.   You have muscle weakness.   You start losing your balance or have trouble walking.   You feel faint or pass out.  MAKE SURE YOU:   Understand these instructions.   Will watch your condition.   Will get help right away if you are not doing well or get worse.  Document Released: 11/15/2005 Document Revised: 11/04/2011 Document Reviewed: 07/01/2009 [facility] Patient Information 2012 [employer], [place].Contraception Choices Contraception (birth control) is the use of any methods or devices to prevent pregnancy. Below are some methods to help avoid pregnancy. HORMONAL METHODS   Contraceptive implant. This is a thin, plastic tube containing progesterone hormone. It does not contain estrogen hormone. Your caregiver inserts the tube in the inner part of the upper arm. The tube can remain in place for up to 3 years. After 3 years, the implant must be removed. The implant prevents the ovaries from releasing an egg (ovulation), thickens the cervical mucus which prevents sperm from entering the uterus, and thins the  lining of the inside of the uterus.   Progesterone-only injections. These injections are given every 3 months by your caregiver to prevent pregnancy. This synthetic progesterone hormone stops the ovaries from releasing eggs. It also thickens cervical mucus and changes the uterine lining. This makes it harder for sperm to survive in the uterus.   Birth control pills. These pills contain estrogen and progesterone hormone. They work by stopping the egg from forming in the ovary (ovulation). Birth control pills  are prescribed by a caregiver.Birth control pills can also be used to treat heavy periods.   Minipill. This type of birth control pill contains only the progesterone hormone. They are taken every day of each month and must be prescribed by your caregiver.   Birth control patch. The patch contains hormones similar to those in birth control pills. It must be changed once a week and is prescribed by a caregiver.   Vaginal ring. The ring contains hormones similar to those in birth control pills. It is left in the vagina for 3 weeks, removed for 1 week, and then a new one is put back in place. The patient must be comfortable inserting and removing the ring from the vagina.A caregiver's prescription is necessary.   Emergency contraception. Emergency contraceptives prevent pregnancy after unprotected sexual intercourse. This pill can be taken right after sex or up to 5 days after unprotected sex. It is most effective the sooner you take the pills after having sexual intercourse. Emergency contraceptive pills are available without a prescription. Check with your pharmacist. Do not use emergency contraception as your only form of birth control.  BARRIER METHODS   Female condom. This is a thin sheath (latex or rubber) that is worn over the penis during sexual intercourse. It can be used with spermicide to increase effectiveness.   Female condom. This is a soft, loose-fitting sheath that is put into the vagina before sexual intercourse.   Diaphragm. This is a soft, latex, dome-shaped barrier that must be fitted by a caregiver. It is inserted into the vagina, along with a spermicidal jelly. It is inserted before intercourse. The diaphragm should be left in the vagina for 6 to 8 hours after intercourse.   Cervical cap. This is a round, soft, latex or plastic cup that fits over the cervix and must be fitted by a caregiver. The cap can be left in place for up to 48 hours after intercourse.   Sponge. This is a  soft, circular piece of polyurethane foam. The sponge has spermicide in it. It is inserted into the vagina after wetting it and before sexual intercourse.   Spermicides. These are chemicals that kill or block sperm from entering the cervix and uterus. They come in the form of creams, jellies, suppositories, foam, or tablets. They do not require a prescription. They are inserted into the vagina with an applicator before having sexual intercourse. The process must be repeated every time you have sexual intercourse.  INTRAUTERINE CONTRACEPTION  Intrauterine device (IUD). This is a T-shaped device that is put in a woman's uterus during a menstrual period to prevent pregnancy. There are 2 types:   Copper IUD. This type of IUD is wrapped in copper wire and is placed inside the uterus. Copper makes the uterus and fallopian tubes produce a fluid that kills sperm. It can stay in place for 10 years.   Hormone IUD. This type of IUD contains the hormone progestin (synthetic progesterone). The hormone thickens the cervical mucus and prevents sperm  from entering the uterus, and it also thins the uterine lining to prevent implantation of a fertilized egg. The hormone can weaken or kill the sperm that get into the uterus. It can stay in place for 5 years.  PERMANENT METHODS OF CONTRACEPTION  Female tubal ligation. This is when the woman's fallopian tubes are surgically sealed, tied, or blocked to prevent the egg from traveling to the uterus.   Female sterilization. This is when the female has the tubes that carry sperm tied off (vasectomy).This blocks sperm from entering the vagina during sexual intercourse. After the procedure, the man can still ejaculate fluid (semen).  NATURAL PLANNING METHODS  Natural family planning. This is not having sexual intercourse or using a barrier method (condom, diaphragm, cervical cap) on days the woman could become pregnant.   Calendar method. This is keeping track of the length of  each menstrual cycle and identifying when you are fertile.   Ovulation method. This is avoiding sexual intercourse during ovulation.   Symptothermal method. This is avoiding sexual intercourse during ovulation, using a thermometer and ovulation symptoms.   Post-ovulation method. This is timing sexual intercourse after you have ovulated.  Regardless of which type or method of contraception you choose, it is important that you use condoms to protect against the transmission of sexually transmitted diseases (STDs). Talk with your caregiver about which form of contraception is most appropriate for you. Document Released: 11/15/2005 Document Revised: 11/04/2011 Document Reviewed: 03/24/2011 [facility] Patient Information 2012 [place], [place].

## 2012-08-15 NOTE — ED Provider Notes (Signed)
Medical screening examination/treatment/procedure(s) were performed by non-physician practitioner and as supervising physician I was immediately available for consultation/collaboration.   Charles B. Sheldon, MD 08/15/12 0833 

## 2012-08-15 NOTE — Progress Notes (Signed)
Patient given informed consent, signed copy in the chart, time out was performed. Pregnancy test was negative. Appropriate time out taken.  Patient's left arm was prepped and draped in the usual sterile fashion.. The ruler used to measure and mark insertion area.  Pt was prepped with alcohol swab and then injected with 3 cc of 1% lidocaine with out epinephrine.  Pt was prepped with betadine, Implanon removed form packaging,  Device confirmed in needle, then inserted full length of needle and withdrawn per handbook instructions.  Pt insertion site covered with sterile dressing.   Minimal blood loss.  Pt tolerated the procedure well.  Note : Nexplanon was used today. Post instructions were concerning infection, heavy lifting.   Pt also wanted today to talk about her migraines. 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 She was seen at [facility] ED and MRI and CT were both negative. She has been referred to [facility]. In the meantime she is requesting refill of Flexeril and will add Imitrex. She is instructed to pump and dump following dose. RTC PRN

## 2012-08-16 NOTE — ED Provider Notes (Signed)
Medical screening examination/treatment/procedure(s) were performed by non-physician practitioner and as supervising physician I was immediately available for consultation/collaboration.   Loren Racer, MD 08/16/12 [phone]

## 2013-03-19 ENCOUNTER — Encounter: Payer: Self-pay | Admitting: *Deleted

## 2013-03-21 ENCOUNTER — Ambulatory Visit: Payer: Medicaid Other | Admitting: Family Medicine

## 2013-04-03 ENCOUNTER — Encounter: Payer: Self-pay | Admitting: Obstetrics and Gynecology

## 2013-04-03 ENCOUNTER — Ambulatory Visit (INDEPENDENT_AMBULATORY_CARE_PROVIDER_SITE_OTHER): Payer: Self-pay | Admitting: Obstetrics and Gynecology

## 2013-04-03 VITALS — BP 112/73 | HR 86 | Ht 64.0 in | Wt 159.0 lb

## 2013-04-03 DIAGNOSIS — Z3046 Encounter for surveillance of implantable subdermal contraceptive: Secondary | ICD-10-CM

## 2013-04-03 DIAGNOSIS — Z3009 Encounter for other general counseling and advice on contraception: Secondary | ICD-10-CM

## 2013-04-03 MED ORDER — NORGESTIMATE-ETH ESTRADIOL 0.25-35 MG-MCG PO TABS: 1.0000 | ORAL_TABLET | Freq: Every day | ORAL | Status: DC

## 2013-04-03 NOTE — Progress Notes (Signed)
Patient ID: [name], female   DOB: 09-08-1986, 27 y.o.   MRN: [id number] 27 yo here for Nexplanon removal . Patient had Nexplanon inserted in 07/2012 and states that she has been bleeding since that time, heavier than spotting. She desires the implant to be removed and plans to use OCP for birth control. Patient has used OCP in the past without any complications.  Removal Patient given informed consent for removal of her Implanon, time out was performed.  Signed copy in the chart.  Appropriate time out taken. Implanon site identified.  [name] prepped in usual sterile fashon. One cc of 1% lidocaine was used to anesthetize the [name] at the distal end of the implant. A small stab incision was made right beside the implant on the distal portion.  The implanon rod was grasped using hemostats and removed without difficulty.  There was less than 3 cc blood loss. There were no complications.  A small amount of antibiotic ointment and steri-strips were applied over the small incision.  A pressure bandage was applied to reduce any bruising.  The patient tolerated the procedure well and was given post procedure instructions.  Rx Sprintec provided. Patient advised to use back up form of birth control for 2 weeks

## 2013-04-29 DIAGNOSIS — IMO0002 Reserved for concepts with insufficient information to code with codable children: Secondary | ICD-10-CM

## 2013-04-29 HISTORY — DX: Reserved for concepts with insufficient information to code with codable children: IMO0002

## 2013-04-30 ENCOUNTER — Encounter (HOSPITAL_COMMUNITY): Payer: Self-pay | Admitting: *Deleted

## 2013-04-30 ENCOUNTER — Emergency Department (HOSPITAL_COMMUNITY)
Admission: EM | Admit: 2013-04-30 | Discharge: 2013-04-30 | Disposition: A | Discharge status: Home / Self Care | Payer: BC Managed Care – PPO | Source: Home / Self Care

## 2013-04-30 DIAGNOSIS — J069 Acute upper respiratory infection, unspecified: Secondary | ICD-10-CM

## 2013-04-30 MED ORDER — FEXOFENADINE HCL 180 MG PO TABS: 180.0000 mg | ORAL_TABLET | Freq: Every day | ORAL | Status: DC

## 2013-04-30 MED ORDER — FLUTICASONE PROPIONATE 50 MCG/ACT NA SUSP: 1.0000 | Freq: Two times a day (BID) | NASAL | Status: DC

## 2013-04-30 MED ORDER — AZITHROMYCIN 250 MG PO TABS: ORAL_TABLET | ORAL | Status: DC

## 2013-04-30 NOTE — ED Notes (Signed)
Pt  Has  Symptoms  Of  Sinus  Congestion  With a  Non  Productive  Cough   Drainage  And  sorethroat     Since  Yesterday       Symptoms  Not  releived  By otc  meds      Sitting upright on  Exam table  Speaking in  Complete  sentances
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
## 2013-05-15 ENCOUNTER — Other Ambulatory Visit: Payer: Self-pay | Admitting: Orthopedic Surgery

## 2013-05-21 ENCOUNTER — Encounter (HOSPITAL_BASED_OUTPATIENT_CLINIC_OR_DEPARTMENT_OTHER): Payer: Self-pay | Admitting: *Deleted

## 2013-05-25 ENCOUNTER — Encounter (HOSPITAL_BASED_OUTPATIENT_CLINIC_OR_DEPARTMENT_OTHER)
Admission: RE | Disposition: A | Discharge status: Home / Self Care | Payer: Self-pay | Source: Ambulatory Visit | Attending: Orthopedic Surgery

## 2013-05-25 ENCOUNTER — Encounter (HOSPITAL_BASED_OUTPATIENT_CLINIC_OR_DEPARTMENT_OTHER): Payer: Self-pay

## 2013-05-25 ENCOUNTER — Ambulatory Visit (HOSPITAL_BASED_OUTPATIENT_CLINIC_OR_DEPARTMENT_OTHER)
Admission: RE | Admit: 2013-05-25 | Discharge: 2013-05-25 | Disposition: A | Discharge status: Home / Self Care | Payer: BC Managed Care – PPO | Source: Ambulatory Visit | Attending: Orthopedic Surgery | Admitting: Orthopedic Surgery

## 2013-05-25 ENCOUNTER — Ambulatory Visit (HOSPITAL_BASED_OUTPATIENT_CLINIC_OR_DEPARTMENT_OTHER): Payer: BC Managed Care – PPO | Admitting: Anesthesiology

## 2013-05-25 ENCOUNTER — Encounter (HOSPITAL_BASED_OUTPATIENT_CLINIC_OR_DEPARTMENT_OTHER): Payer: Self-pay | Admitting: Anesthesiology

## 2013-05-25 DIAGNOSIS — F410 Panic disorder [episodic paroxysmal anxiety] without agoraphobia: Secondary | ICD-10-CM | POA: Insufficient documentation

## 2013-05-25 DIAGNOSIS — M24419 Recurrent dislocation, unspecified shoulder: Secondary | ICD-10-CM | POA: Insufficient documentation

## 2013-05-25 DIAGNOSIS — Z87891 Personal history of nicotine dependence: Secondary | ICD-10-CM | POA: Insufficient documentation

## 2013-05-25 DIAGNOSIS — G43909 Migraine, unspecified, not intractable, without status migrainosus: Secondary | ICD-10-CM | POA: Insufficient documentation

## 2013-05-25 DIAGNOSIS — Z79899 Other long term (current) drug therapy: Secondary | ICD-10-CM | POA: Insufficient documentation

## 2013-05-25 DIAGNOSIS — F411 Generalized anxiety disorder: Secondary | ICD-10-CM | POA: Insufficient documentation

## 2013-05-25 DIAGNOSIS — M25311 Other instability, right shoulder: Secondary | ICD-10-CM | POA: Diagnosis present

## 2013-05-25 DIAGNOSIS — J301 Allergic rhinitis due to pollen: Secondary | ICD-10-CM | POA: Insufficient documentation

## 2013-05-25 HISTORY — DX: Headache: R51

## 2013-05-25 HISTORY — DX: Other instability, right shoulder: M25.311

## 2013-05-25 HISTORY — DX: Reserved for concepts with insufficient information to code with codable children: IMO0002

## 2013-05-25 HISTORY — DX: Other seasonal allergic rhinitis: J30.2

## 2013-05-25 HISTORY — DX: Panic disorder (episodic paroxysmal anxiety): F41.0

## 2013-05-25 HISTORY — PX: SHOULDER ARTHROSCOPY WITH BANKART REPAIR: SHX5673

## 2013-05-25 SURGERY — SHOULDER ARTHROSCOPY WITH BANKART REPAIR
Anesthesia: Regional | Site: Shoulder | Laterality: Right | Wound class: Clean

## 2013-05-25 MED ORDER — LACTATED RINGERS IV SOLN
INTRAVENOUS | Status: DC
  Administered 2013-05-25 (×2): via INTRAVENOUS

## 2013-05-25 MED ORDER — OXYCODONE-ACETAMINOPHEN 10-325 MG PO TABS: 1.0000 | ORAL_TABLET | Freq: Four times a day (QID) | ORAL | Status: DC | PRN

## 2013-05-25 MED ORDER — PROMETHAZINE HCL 25 MG PO TABS: 25.0000 mg | ORAL_TABLET | Freq: Four times a day (QID) | ORAL | Status: DC | PRN

## 2013-05-25 MED ORDER — OXYCODONE HCL 5 MG/5ML PO SOLN: 5.0000 mg | Freq: Once | ORAL | Status: AC | PRN

## 2013-05-25 MED ORDER — MIDAZOLAM HCL 2 MG/ML PO SYRP: 12.0000 mg | ORAL_SOLUTION | Freq: Once | ORAL | Status: DC | PRN

## 2013-05-25 MED ORDER — ONDANSETRON HCL 4 MG/2ML IJ SOLN: INTRAMUSCULAR | Status: DC | PRN

## 2013-05-25 MED ORDER — OXYCODONE HCL 5 MG PO TABS
5.0000 mg | ORAL_TABLET | Freq: Once | ORAL | Status: AC | PRN
  Administered 2013-05-25: 5 mg via ORAL

## 2013-05-25 MED ORDER — METHOCARBAMOL 500 MG PO TABS: 500.0000 mg | ORAL_TABLET | Freq: Four times a day (QID) | ORAL | Status: DC

## 2013-05-25 MED ORDER — SUCCINYLCHOLINE CHLORIDE 20 MG/ML IJ SOLN
INTRAMUSCULAR | Status: DC | PRN
  Administered 2013-05-25: 80 mg via INTRAVENOUS

## 2013-05-25 MED ORDER — SODIUM CHLORIDE 0.9 % IR SOLN
Status: DC | PRN
  Administered 2013-05-25: 4000 mL

## 2013-05-25 MED ORDER — BUPIVACAINE-EPINEPHRINE PF 0.5-1:200000 % IJ SOLN
INTRAMUSCULAR | Status: DC | PRN
  Administered 2013-05-25: 30 mL

## 2013-05-25 MED ORDER — MIDAZOLAM HCL 2 MG/2ML IJ SOLN
1.0000 mg | INTRAMUSCULAR | Status: DC | PRN
  Administered 2013-05-25: 2 mg via INTRAVENOUS

## 2013-05-25 MED ORDER — PROPOFOL 10 MG/ML IV BOLUS
INTRAVENOUS | Status: DC | PRN
  Administered 2013-05-25: 160 mg via INTRAVENOUS

## 2013-05-25 MED ORDER — DEXAMETHASONE SODIUM PHOSPHATE 4 MG/ML IJ SOLN
INTRAMUSCULAR | Status: DC | PRN
  Administered 2013-05-25: 8 mg via INTRAVENOUS

## 2013-05-25 MED ORDER — SENNA-DOCUSATE SODIUM 8.6-50 MG PO TABS: 2.0000 | ORAL_TABLET | Freq: Every day | ORAL | Status: DC

## 2013-05-25 MED ORDER — CEFAZOLIN SODIUM-DEXTROSE 2-3 GM-% IV SOLR
2.0000 g | INTRAVENOUS | Status: AC
  Administered 2013-05-25: 2 g via INTRAVENOUS

## 2013-05-25 MED ORDER — FENTANYL CITRATE 0.05 MG/ML IJ SOLN
50.0000 ug | INTRAMUSCULAR | Status: DC | PRN
  Administered 2013-05-25: 50 ug via INTRAVENOUS

## 2013-05-25 MED ORDER — ONDANSETRON HCL 4 MG/2ML IJ SOLN
INTRAMUSCULAR | Status: DC | PRN
  Administered 2013-05-25: 4 mg via INTRAVENOUS

## 2013-05-25 MED ORDER — HYDROMORPHONE HCL PF 1 MG/ML IJ SOLN
0.2500 mg | INTRAMUSCULAR | Status: DC | PRN
  Administered 2013-05-25: 0.5 mg via INTRAVENOUS

## 2013-05-25 SURGICAL SUPPLY — 71 items
ANCHOR SUT BIOCOMP LK 2.9X12.5 (Anchor) ×2 IMPLANT
BENZOIN TINCTURE PRP APPL 2/3 (GAUZE/BANDAGES/DRESSINGS) ×2 IMPLANT
BLADE CUTTER GATOR 3.5 (BLADE) ×2 IMPLANT
BLADE GREAT WHITE 4.2 (BLADE) IMPLANT
BLADE SURG 15 STRL LF DISP TIS (BLADE) IMPLANT
BLADE SURG 15 STRL SS (BLADE)
BUR OVAL 4.0 (BURR) IMPLANT
BUR OVAL 6.0 (BURR) IMPLANT
CANISTER OMNI JUG 16 LITER (MISCELLANEOUS) ×2 IMPLANT
CANNULA 5.75X71 LONG (CANNULA) ×4 IMPLANT
CANNULA TWIST IN 8.25X7CM (CANNULA) ×2 IMPLANT
CANNULA TWIST IN 8.25X9CM (CANNULA) IMPLANT
CLOTH BEACON ORANGE TIMEOUT ST (SAFETY) ×2 IMPLANT
DECANTER SPIKE VIAL GLASS SM (MISCELLANEOUS) IMPLANT
DRAPE INCISE IOBAN 66X45 STRL (DRAPES) ×2 IMPLANT
DRAPE SHOULDER BEACH CHAIR (DRAPES) ×2 IMPLANT
DRAPE U 20/CS (DRAPES) ×2 IMPLANT
DRAPE U-SHAPE 47X51 STRL (DRAPES) ×2 IMPLANT
DRSG PAD ABDOMINAL 8X10 ST (GAUZE/BANDAGES/DRESSINGS) ×2 IMPLANT
DURAPREP 26ML APPLICATOR (WOUND CARE) ×2 IMPLANT
ELECT REM PT RETURN 9FT ADLT (ELECTROSURGICAL) ×2
ELECTRODE REM PT RTRN 9FT ADLT (ELECTROSURGICAL) ×1 IMPLANT
FIBERSTICK 2 (SUTURE) ×2 IMPLANT
GLOVE BIO SURGEON STRL SZ 6.5 (GLOVE) ×2 IMPLANT
GLOVE BIO SURGEON STRL SZ8 (GLOVE) ×2 IMPLANT
GLOVE BIOGEL PI IND STRL 7.0 (GLOVE) ×1 IMPLANT
GLOVE BIOGEL PI IND STRL 8 (GLOVE) ×2 IMPLANT
GLOVE BIOGEL PI INDICATOR 7.0 (GLOVE) ×1
GLOVE BIOGEL PI INDICATOR 8 (GLOVE) ×2
GLOVE EXAM NITRILE MD LF STRL (GLOVE) ×2 IMPLANT
GLOVE ORTHO TXT STRL SZ7.5 (GLOVE) ×2 IMPLANT
GOWN BRE IMP PREV XXLGXLNG (GOWN DISPOSABLE) ×4 IMPLANT
IMMOBILIZER SHOULDER XLGE (ORTHOPEDIC SUPPLIES) IMPLANT
IV NS IRRIG 3000ML ARTHROMATIC (IV SOLUTION) ×4 IMPLANT
KIT PUSHLOCK 2.9 HIP (KITS) ×2 IMPLANT
KIT SHOULDER TRACTION (DRAPES) ×2 IMPLANT
LASSO 90 CVE QUICKPAS (DISPOSABLE) ×2 IMPLANT
LASSO CRESCENT QUICKPASS (SUTURE) ×2 IMPLANT
LASSO SUT 90 DEGREE (SUTURE) IMPLANT
NEEDLE SCORPION MULTI FIRE (NEEDLE) IMPLANT
PACK ARTHROSCOPY DSU (CUSTOM PROCEDURE TRAY) ×2 IMPLANT
PACK BASIN DAY SURGERY FS (CUSTOM PROCEDURE TRAY) ×2 IMPLANT
SET ARTHROSCOPY TUBING (MISCELLANEOUS) ×1
SET ARTHROSCOPY TUBING LN (MISCELLANEOUS) ×1 IMPLANT
SHEET MEDIUM DRAPE 40X70 STRL (DRAPES) ×2 IMPLANT
SLEEVE SCD COMPRESS KNEE MED (MISCELLANEOUS) ×2 IMPLANT
SLING ARM FOAM STRAP LRG (SOFTGOODS) IMPLANT
SLING ARM FOAM STRAP MED (SOFTGOODS) IMPLANT
SLING ARM FOAM STRAP XLG (SOFTGOODS) IMPLANT
SLING ARM IMMOBILIZER LRG (SOFTGOODS) ×2 IMPLANT
SLING ARM IMMOBILIZER MED (SOFTGOODS) IMPLANT
SPONGE GAUZE 4X4 12PLY (GAUZE/BANDAGES/DRESSINGS) ×2 IMPLANT
STRIP CLOSURE SKIN 1/2X4 (GAUZE/BANDAGES/DRESSINGS) ×2 IMPLANT
SUT FIBERWIRE #2 38 T-5 BLUE (SUTURE)
SUT LASSO 45 DEGREE (SUTURE) IMPLANT
SUT LASSO 45 DEGREE LEFT (SUTURE) IMPLANT
SUT LASSO 45D RIGHT (SUTURE) IMPLANT
SUT MNCRL AB 4-0 PS2 18 (SUTURE) ×2 IMPLANT
SUT PDS AB 1 CT  36 (SUTURE)
SUT PDS AB 1 CT 36 (SUTURE) IMPLANT
SUT TIGER TAPE 7 IN WHITE (SUTURE) IMPLANT
SUT VIC AB 3-0 SH 27 (SUTURE)
SUT VIC AB 3-0 SH 27X BRD (SUTURE) IMPLANT
SUTURE FIBERWR #2 38 T-5 BLUE (SUTURE) IMPLANT
TAPE FIBER 2MM 7IN #2 BLUE (SUTURE) IMPLANT
TAPE LABRALWHITE 1.5X36 (TAPE) ×2 IMPLANT
TOWEL OR 17X24 6PK STRL BLUE (TOWEL DISPOSABLE) ×2 IMPLANT
TOWEL OR NON WOVEN STRL DISP B (DISPOSABLE) ×2 IMPLANT
TUBE CONNECTING 20X1/4 (TUBING) IMPLANT
WAND STAR VAC 90 (SURGICAL WAND) ×2 IMPLANT
WATER STERILE IRR 1000ML POUR (IV SOLUTION) ×2 IMPLANT

## 2013-05-25 NOTE — Anesthesia Procedure Notes (Addendum)
  Narrative:    Anesthesia Regional Block:  Interscalene brachial plexus block  Pre-Anesthetic Checklist: ,, timeout performed, Correct Patient, Correct Site, Correct Laterality, Correct Procedure, Correct Position, site marked, Risks and benefits discussed, pre-op evaluation,  At surgeon's request and post-op pain management  Laterality: Right  Prep: Maximum Sterile Barrier Precautions used and chloraprep       Needles:  Injection technique: Single-shot  Needle Type: Echogenic Stimulator Needle     Needle Length: 5cm 5 cm Needle Gauge: 22 and 22 G    Additional Needles:  Procedures: ultrasound guided (picture in chart) and nerve stimulator Interscalene brachial plexus block  Nerve Stimulator or Paresthesia:  Response: Biceps response, 0.4 mA,   Additional Responses:   Narrative:  Start time: 05/25/2013 8:20 AM End time: 05/25/2013 8:28 AM Injection made incrementally with aspirations every 5 mL. Anesthesiologist: Sampson Goon, MD  Additional Notes: 2% Lidocaine skin wheel.   Interscalene brachial plexus block Procedure Name: Intubation Date/Time: 05/25/2013 9:20 AM Performed by: Lance Coon Pre-anesthesia Checklist: Patient identified, Emergency Drugs available, Suction available and Patient being monitored Patient Re-evaluated:Patient Re-evaluated prior to inductionOxygen Delivery Method: Circle System Utilized Preoxygenation: Pre-oxygenation with 100% oxygen Intubation Type: IV induction Ventilation: Mask ventilation without difficulty Laryngoscope Size: Mac and 3 Grade View: Grade I Tube type: Oral Tube size: 7.0 mm Number of attempts: 1 Airway Equipment and Method: stylet,  oral airway and LTA kit utilized Placement Confirmation: ETT inserted through vocal cords under direct vision,  positive ETCO2 and breath sounds checked- equal and bilateral Tube secured with: Tape Dental Injury: Teeth and Oropharynx as per pre-operative assessment

## 2013-05-25 NOTE — Anesthesia Preprocedure Evaluation (Signed)
Anesthesia Evaluation  Patient identified by MRN, date of birth, ID band Patient awake    Reviewed: Allergy & Precautions, H&P , NPO status , Patient's Chart, lab work & pertinent test results  Airway Mallampati: I TM Distance: >3 FB Neck ROM: Full    Dental no notable dental hx. (+) Teeth Intact and Dental Advisory Given   Pulmonary neg pulmonary ROS,  breath sounds clear to auscultation  Pulmonary exam normal       Cardiovascular negative cardio ROS  Rhythm:Regular Rate:Normal     Neuro/Psych  Headaches, PSYCHIATRIC DISORDERS    GI/Hepatic negative GI ROS, Neg liver ROS,   Endo/Other  negative endocrine ROS  Renal/GU negative Renal ROS  negative genitourinary   Musculoskeletal   Abdominal   Peds  Hematology negative hematology ROS (+)   Anesthesia Other Findings   Reproductive/Obstetrics negative OB ROS                           Anesthesia Physical Anesthesia Plan  ASA: II  Anesthesia Plan: General and Regional   Post-op Pain Management:    Induction: Intravenous  Airway Management Planned: Oral ETT  Additional Equipment:   Intra-op Plan:   Post-operative Plan: Extubation in OR  Informed Consent: I have reviewed the patients History and Physical, chart, labs and discussed the procedure including the risks, benefits and alternatives for the proposed anesthesia with the patient or authorized representative who has indicated his/her understanding and acceptance.   Dental advisory given  Plan Discussed with: CRNA and Surgeon  Anesthesia Plan Comments:         Anesthesia Quick Evaluation

## 2013-05-25 NOTE — Progress Notes (Signed)
Assisted Dr. Fitzgerald with right, ultrasound guided, supraclavicular block. Side rails up, monitors on throughout procedure. See vital signs in flow sheet. Tolerated Procedure well. 

## 2013-05-25 NOTE — Transfer of Care (Signed)
Immediate Anesthesia Transfer of Care Note  Patient: Engineer, [name]  Procedure(s) Performed: Procedure(s): RIGHT SHOULDER ARTHROSCOPY WITH BANKART REPAIR (Right)  Patient Location: PACU  Anesthesia Type:GA combined with regional for post-op pain  Level of Consciousness: awake  Airway & Oxygen Therapy: Patient Spontanous Breathing and Patient connected to face mask oxygen  Post-op Assessment: Report given to PACU RN and Post -op Vital signs reviewed and stable  Post vital signs: Reviewed and stable  Complications: No apparent anesthesia complications

## 2013-05-25 NOTE — Anesthesia Postprocedure Evaluation (Signed)
  Anesthesia Post-op Note  Patient: Engineer, [name]  Procedure(s) Performed: Procedure(s): RIGHT SHOULDER ARTHROSCOPY WITH BANKART REPAIR (Right)  Patient Location: PACU  Anesthesia Type:GA combined with regional for post-op pain  Level of Consciousness: awake, alert  and oriented  Airway and Oxygen Therapy: Patient Spontanous Breathing  Post-op Pain: none  Post-op Assessment: Post-op Vital signs reviewed, Patient's Cardiovascular Status Stable, Respiratory Function Stable, Patent Airway and No signs of Nausea or vomiting  Post-op Vital Signs: Reviewed and stable  Complications: No apparent anesthesia complications

## 2013-05-25 NOTE — Op Note (Signed)
05/25/2013  10:28 AM  PATIENT:  Engineer, [name]    PRE-OPERATIVE DIAGNOSIS:  RIGHT SHOULDER DISLOCATION/SUBLUXATION RECURRENT, multidirectional instability  POST-OPERATIVE DIAGNOSIS:  Same  PROCEDURE:  RIGHT SHOULDER ARTHROSCOPY WITH ANTERIOR AND POSTERIOR CAPSULORRHAPHY  SURGEON:  Eulas Post, MD  PHYSICIAN ASSISTANT: Janace Litten, OPA-C, present and scrubbed throughout the case, critical for completion in a timely fashion, and for retraction, instrumentation, and closure.  ANESTHESIA:   General  PREOPERATIVE INDICATIONS:  [name] is a  27 y.o. female with a diagnosis of RIGHT SHOULDER DISLOCATION/SUBLUXATION RECURRENT who failed conservative measures and elected for surgical management.  She had multiple rounds of physical therapy, and continued to have recurrent instability episodes almost on a daily basis.  The risks benefits and alternatives were discussed with the patient preoperatively including but not limited to the risks of infection, bleeding, nerve injury, cardiopulmonary complications, the need for revision surgery, among others, and the patient was willing to proceed.  OPERATIVE IMPLANTS: Arthrex bio composite 2.9 mm short push lock anchors x1. I used a total of 1 #2 labral fiber tapes in an inverted horizontal mattress configuration. I also used a FiberWire #2 suture in a simple configuration for the posterior capsulorrhaphy, sewing down to the intact labrum.  OPERATIVE FINDINGS: The shoulder had fairly significant multidirectional instability, although I was able to get her out the back fairly easily, but she was fairly loose globally. The articular surfaces were normal. Rotator cuff and biceps tendon and labrum were all normal. The capsule was redundant anteriorly and posteriorly.  OPERATIVE PROCEDURE: The patient was brought to the operating room and placed in the supine position. General anesthesia was administered. IV antibiotics were given. General anesthesia was  administered.   The upper extremity was examined and found to be grossly unstable, fairly diffusely. The upper extremity was prepped and draped in the usual sterile fashion. The patient was in a semilateral decubitus position.  Time out was performed. Diagnostic arthroscopy was carried out the above-named findings.   I placed 2 anterior cannulas, and then prepared the capsule with a rasp to optimize healing.    I then used a suture passer to pass an inverted labral fiber tape on either side of the inferior anterior glenohumeral ligament. This had excellent purchase on the tissue.  I anchored the anterior inferior glenohumeral ligament into the glenoid using a push lock anchor.   This restored the anterior capsular tightness, and so I did not need a second suture.  There was still however some posterior redundancy, and so I placed a simple suture after having placed a total of 2 cannulas from the back. The suture was tied, the capsular redundancy removed, and the suture was cut.  Excellent soft tissue restoration of tension was achieved, and the arthroscopic cannulas were removed, and the portals closed with Monocryl followed by Steri-Strips and sterile gauze. The patient was awakened and returned to the PACU in stable and satisfactory condition. There were no complications and she tolerated the procedure well.

## 2013-05-25 NOTE — H&P (Signed)
PREOPERATIVE H&P  Chief Complaint: RIGHT SHOULDER DISLOCATION/SUBLUXATION RECURRENT  HPI: [name] is a 27 y.o. female who presents for preoperative history and physical with a diagnosis of RIGHT SHOULDER DISLOCATION/SUBLUXATION RECURRENT. Symptoms are rated as moderate to severe, and have been worsening.  This is significantly impairing activities of daily living.  She has elected for surgical management. She has had 2 rounds of physical therapy, and continues to have significant symptoms with her shoulder. The first time this happened was when she was 27 years old, and it has been happening more and more ever since. She has difficulty lifting her daughter.  Past Medical History  Diagnosis Date  . Anxiety   . Headache(784.0)     migraines  . Seasonal allergies   . Panic attacks   . Dislocation or subluxation of shoulder 04/2013    right - recurrent   Past Surgical History  Procedure Laterality Date  . Wisdom tooth extraction     History   Social History  . Marital Status: Single    Spouse Name: N/A    Number of Children: N/A  . Years of Education: N/A   Social History Main Topics  . Smoking status: Former Smoker -- 0.50 packs/day for 8 years    Quit date: 10/22/2011  . Smokeless tobacco: Never Used  . Alcohol Use: Yes     Comment: rarely  . Drug Use: No  . Sexually Active: None   Other Topics Concern  . None   Social History Narrative  . None   Family History  Problem Relation Age of Onset  . Cancer Mother     uterine  . Depression Mother   . Anesthesia problems Mother     hard to wake up post-op  . Heart disease Father   . Heart disease Maternal Grandfather    No Known Allergies Prior to Admission medications   Medication Sig Start Date End Date Taking? Authorizing Provider  clonazePAM (KLONOPIN) 1 MG tablet Take 1 mg by mouth 2 (two) times daily.   Yes Historical Provider, MD  fexofenadine (ALLEGRA) 180 MG tablet Take 1 tablet (180 mg total) by mouth  daily. 04/30/13  Yes Linna Hoff, MD  ibuprofen (ADVIL,MOTRIN) 200 MG tablet Take 600 mg by mouth every 6 (six) hours as needed.    Yes Historical Provider, MD  norgestimate-ethinyl estradiol (ORTHO-CYCLEN,SPRINTEC,PREVIFEM) 0.25-35 MG-MCG tablet Take 1 tablet by mouth daily. 04/03/13  Yes Peggy Constant, MD  SUMAtriptan (IMITREX) 100 MG tablet Take 1 tablet (100 mg total) by mouth once as needed for migraine. 08/15/12  Yes Carolynn Serve, NP  traMADol (ULTRAM) 50 MG tablet Take 50 mg by mouth every 6 (six) hours as needed for pain.   Yes Historical Provider, MD     Positive ROS: All other systems have been reviewed and were otherwise negative with the exception of those mentioned in the HPI and as above.  Physical Exam: General: Alert, no acute distress Cardiovascular: No pedal edema Respiratory: No cyanosis, no use of accessory musculature GI: No organomegaly, abdomen is soft and non-tender Skin: No lesions in the area of chief complaint Neurologic: Sensation intact distally Psychiatric: Patient is competent for consent with normal mood and affect Lymphatic: No axillary or cervical lymphadenopathy  MUSCULOSKELETAL: Right shoulder has positive hypermobility, with generalized ligamentous laxity, 0-180 of motion.  Assessment: RIGHT SHOULDER DISLOCATION/SUBLUXATION RECURRENT, multidirectional instability  Plan: Plan for Procedure(s): RIGHT SHOULDER ARTHROSCOPY WITH CAPSULORRHAPHY  The risks benefits and alternatives were discussed with the patient  including but not limited to the risks of nonoperative treatment, versus surgical intervention including infection, bleeding, nerve injury,  blood clots, cardiopulmonary complications, morbidity, mortality, among others, and they were willing to proceed. We've also discussed the risks for recurrent symptoms, incomplete relief of pain, ongoing popping and instability, among others.  Eulas Post, MD Cell [phone]   05/25/2013 7:26 AM

## 2013-05-28 ENCOUNTER — Encounter (HOSPITAL_BASED_OUTPATIENT_CLINIC_OR_DEPARTMENT_OTHER): Payer: Self-pay | Admitting: Orthopedic Surgery

## 2013-09-21 IMAGING — US US OB DETAIL+14 WK
1 series · 14 of 28 positions shown · non-contrast
Comparison: none

[Series 1: us ob detail+14 wk · 0.19mm/px · 14 of 98 slices shown]
[im 4/98]
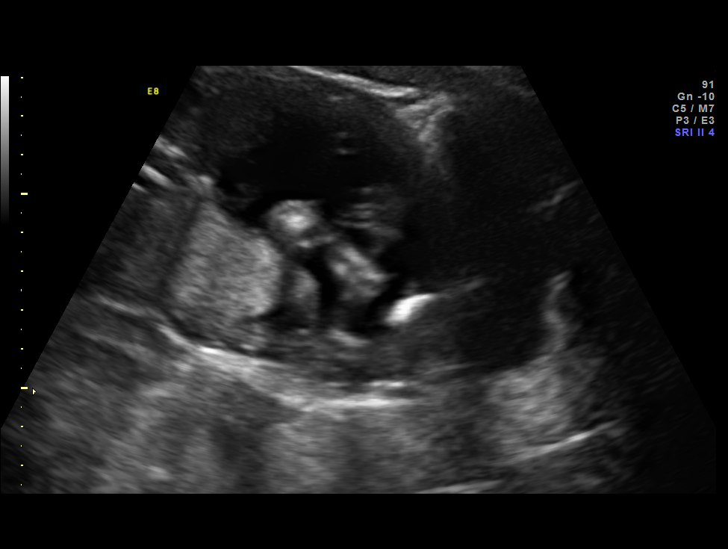
[im 11/98]
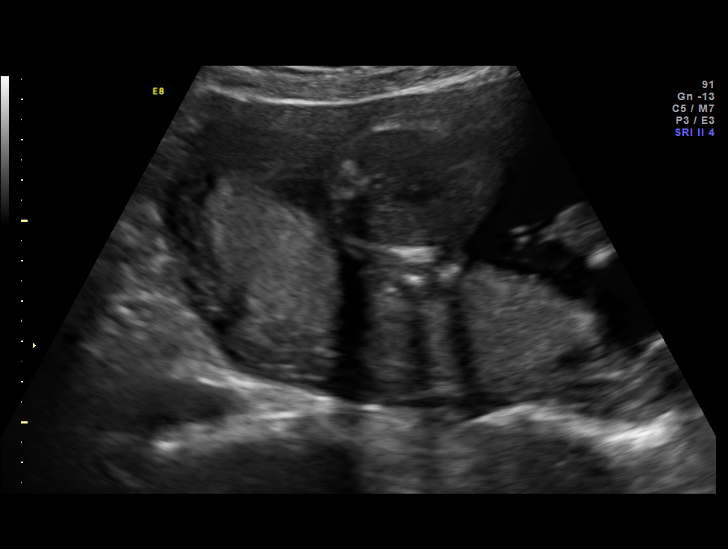
[im 18/98]
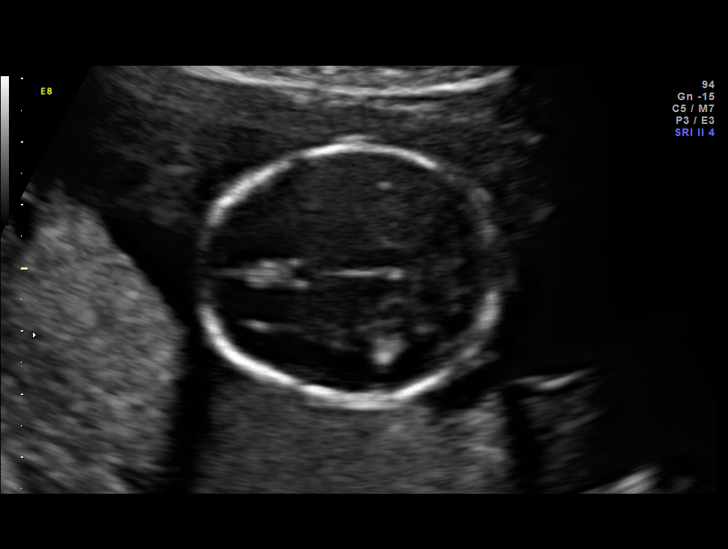
[im 26/98]
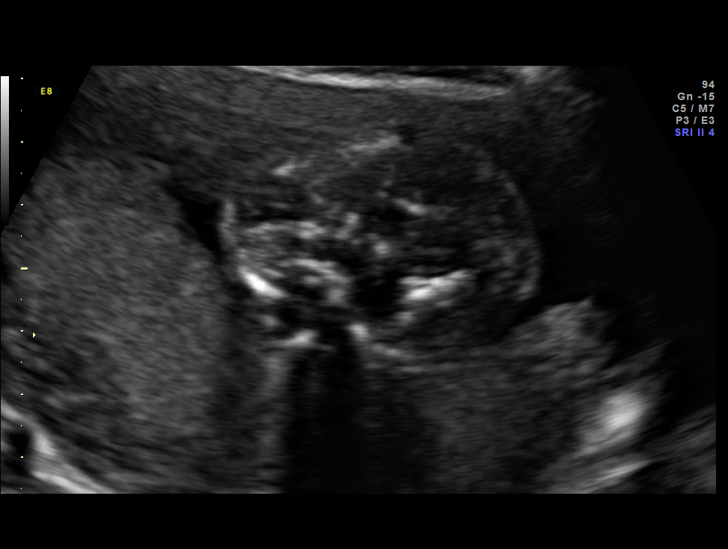
[im 33/98]
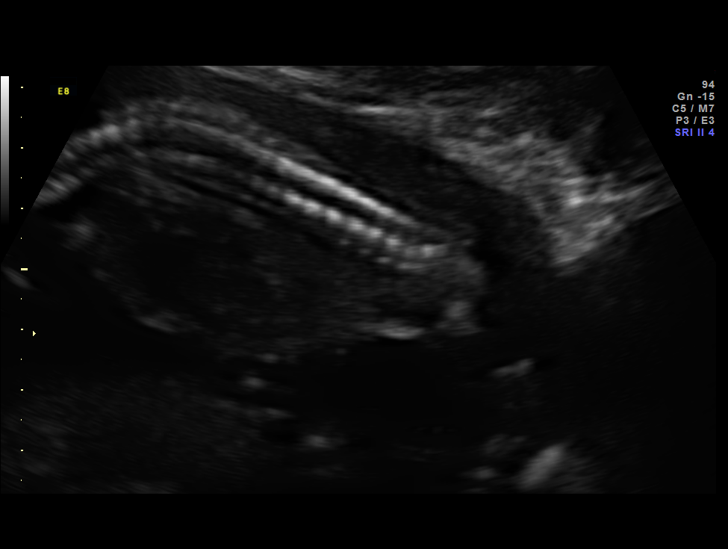
[im 40/98]
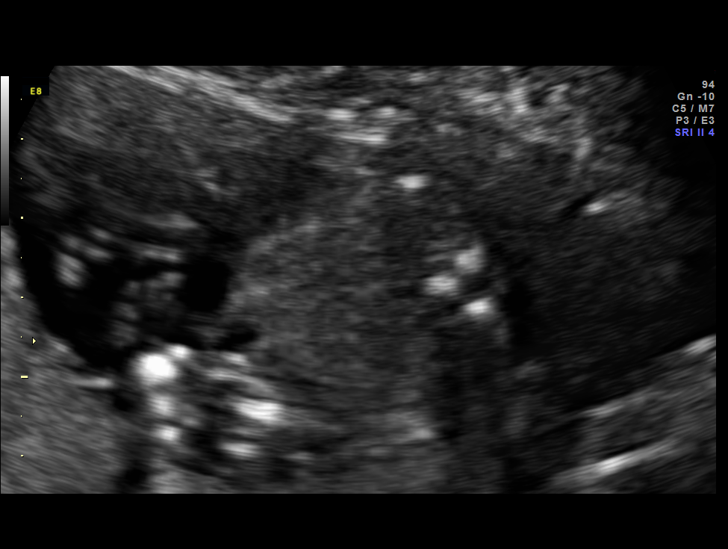
[im 47/98]
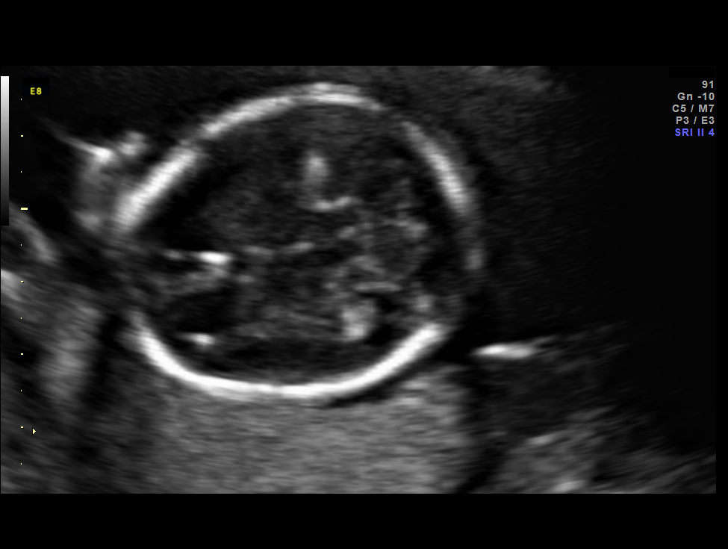
[im 54/98]
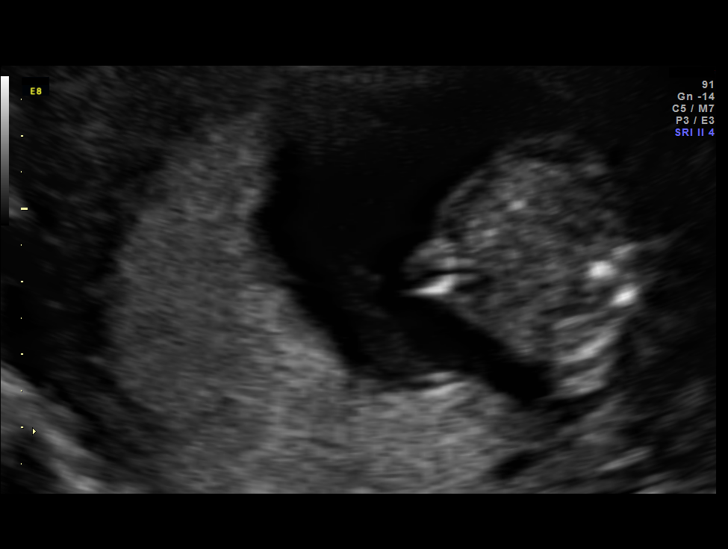
[im 62/98]
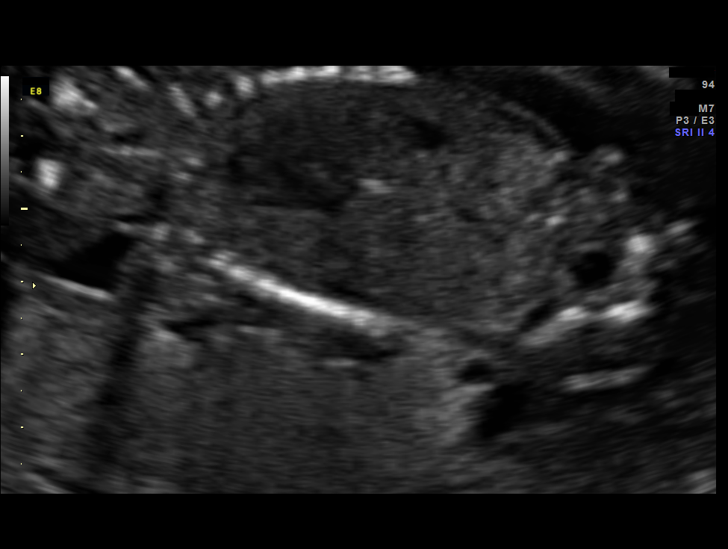
[im 69/98]
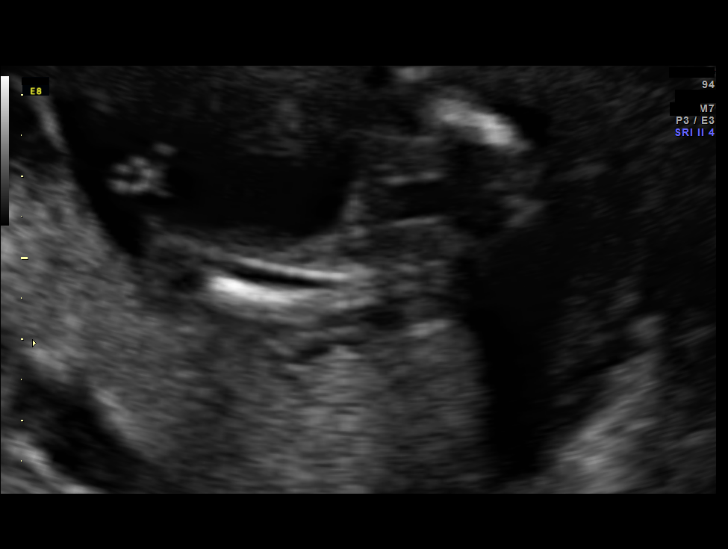
[im 76/98]
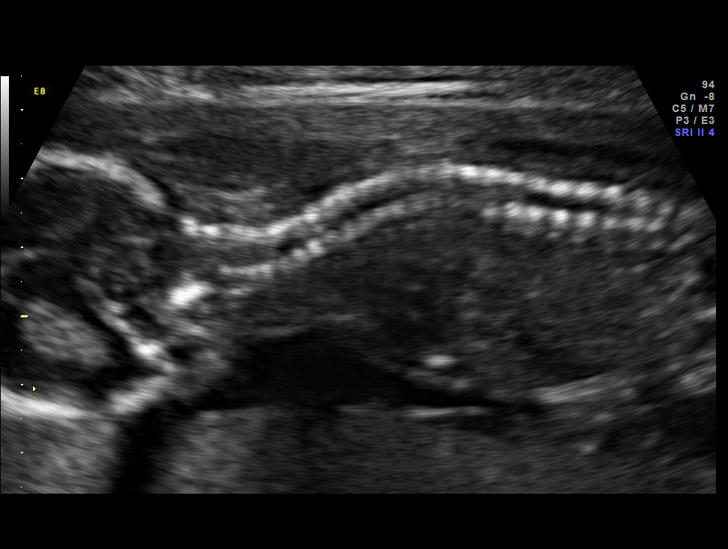
[im 83/98]
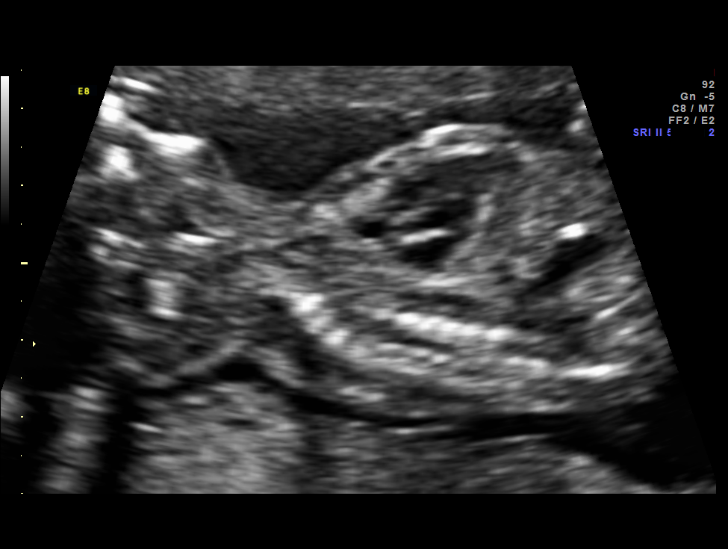
[im 90/98]
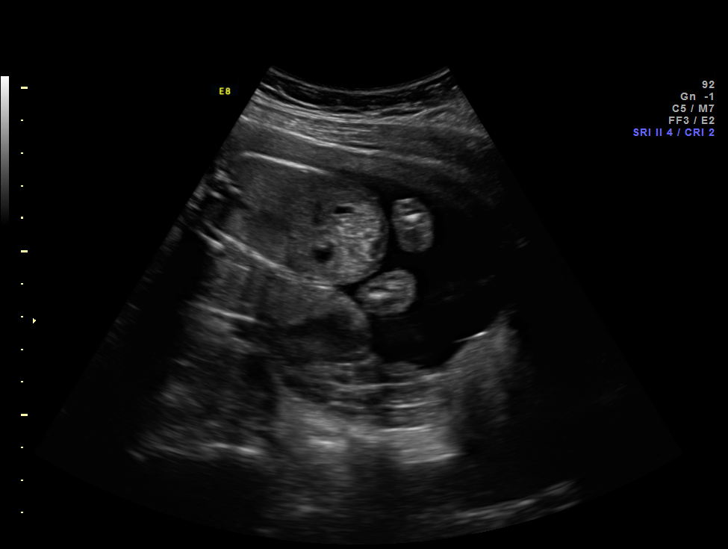
[im 98/98]
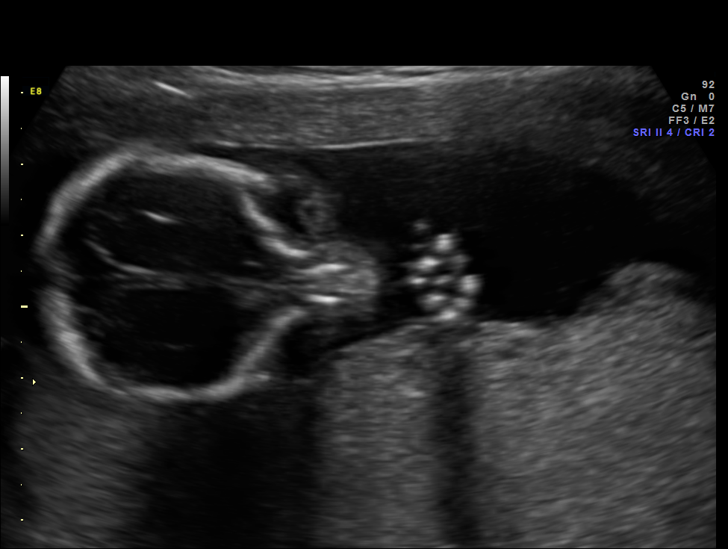

[14 of 28 positions shown; findings below may reference images not displayed]

Canned report from images found in remote index.

Refer to host system for actual result text.

## 2013-10-04 ENCOUNTER — Other Ambulatory Visit: Payer: Self-pay

## 2013-12-10 ENCOUNTER — Ambulatory Visit (INDEPENDENT_AMBULATORY_CARE_PROVIDER_SITE_OTHER): Payer: BC Managed Care – PPO | Admitting: Obstetrics and Gynecology

## 2013-12-10 ENCOUNTER — Encounter: Payer: Self-pay | Admitting: Obstetrics and Gynecology

## 2013-12-10 VITALS — BP 131/73 | HR 88 | Ht 64.0 in | Wt 144.8 lb

## 2013-12-10 DIAGNOSIS — R1032 Left lower quadrant pain: Secondary | ICD-10-CM

## 2013-12-10 NOTE — Progress Notes (Signed)
Patient ID: [name], female   DOB: [birth year], 27 y.o.   MRN: [id number] 28 yo G2P1011 using OCP for contraception presenting today for evaluation of 2-day history of  LLQ pain. Patient describes the pain as throbbing in nature and seem to have gotten worst. She has not tried taking tylenol or ibuprofen. Patient states the pain is non radiating with no other associated symptoms  GENERAL: Well-developed, well-nourished female in no acute distress.  ABDOMEN: Soft, nontender, nondistended. No rebound, no guarding PELVIC: Normal external female genitalia. Vagina is pink and rugated.  Normal discharge. Normal appearing cervix. Uterus is normal in size. No adnexal mass. Tenderness in LLQ. EXTREMITIES: No cyanosis, clubbing, or edema, 2+ distal pulses.  A/P 28 yo with LLQ pain - may be an ovarian cyst - will order pelvic ultrasound - Advised ibuprofen and heat to area for comfort care - RTC prn

## 2013-12-17 ENCOUNTER — Ambulatory Visit (HOSPITAL_COMMUNITY)
Admission: RE | Admit: 2013-12-17 | Discharge: 2013-12-17 | Disposition: A | Discharge status: Home / Self Care | Payer: BC Managed Care – PPO | Source: Ambulatory Visit | Attending: Obstetrics and Gynecology | Admitting: Obstetrics and Gynecology

## 2013-12-17 ENCOUNTER — Telehealth: Payer: Self-pay | Admitting: *Deleted

## 2013-12-17 DIAGNOSIS — R1032 Left lower quadrant pain: Secondary | ICD-10-CM | POA: Insufficient documentation

## 2013-12-17 DIAGNOSIS — N9489 Other specified conditions associated with female genital organs and menstrual cycle: Secondary | ICD-10-CM | POA: Insufficient documentation

## 2013-12-17 DIAGNOSIS — N83209 Unspecified ovarian cyst, unspecified side: Secondary | ICD-10-CM

## 2013-12-17 MED ORDER — OXYCODONE-ACETAMINOPHEN 5-325 MG PO TABS: 1.0000 | ORAL_TABLET | Freq: Four times a day (QID) | ORAL | Status: DC | PRN

## 2013-12-17 NOTE — Telephone Encounter (Signed)
Patient is here today to see if she can get something for pain due to her ultrasound results and still having persistent pain that is not relieved with ibuprofen.

## 2013-12-20 ENCOUNTER — Ambulatory Visit: Payer: BC Managed Care – PPO | Admitting: Obstetrics and Gynecology

## 2013-12-20 ENCOUNTER — Telehealth: Payer: Self-pay | Admitting: *Deleted

## 2013-12-20 ENCOUNTER — Ambulatory Visit (INDEPENDENT_AMBULATORY_CARE_PROVIDER_SITE_OTHER): Payer: BC Managed Care – PPO | Admitting: Obstetrics and Gynecology

## 2013-12-20 ENCOUNTER — Encounter: Payer: Self-pay | Admitting: Obstetrics and Gynecology

## 2013-12-20 VITALS — BP 109/66 | HR 73 | Ht 65.0 in | Wt 143.0 lb

## 2013-12-20 DIAGNOSIS — N83209 Unspecified ovarian cyst, unspecified side: Secondary | ICD-10-CM

## 2013-12-20 NOTE — Telephone Encounter (Signed)
Spoke to patient regarding cyst on ovary.  She states that it is still hurting, I have explained that it can take up to 6 weeks to resolve and that is why we want to do an u/s in 6 weeks.  She was instructed to call us back if her symptoms change or worsen prior to her next scheduled visit.  We will schedule follow up once ultrasound is scheduled.

## 2013-12-20 NOTE — Progress Notes (Signed)
28 yo G2P1011 recently diagnosed with a left hemorrhagic cyst presenting today for follow-up. Results of the ultrasound were reviewed and explained to the patient. Patient describes a [name] throbbing pain on the left side of her abdomen. Patient admits to stopping her OCP since August 2014 as she has been trying to conceive.  12/17/2013 US FINDINGS:  Uterus  Measurements: 8.1 x 4.1 x 5.0 cm. No fibroids or other mass  visualized.  Endometrium  Thickness: 14 mm. No focal abnormality visualized.  Right ovary  Measurements: 2.1 x 1.7 x 2.1 cm. Normal appearance/no adnexal mass.  Left ovary  Measurements: 5.4 x 5.1 x 5.1 cm. Within the left ovary is a 4.7 x  4.5 x 4.6 cm cystic structure with lace-like internal echogenicity.  Other findings  No free fluid.  IMPRESSION:  Large probable hemorrhagic cyst within the left ovary.  Short-interval follow up ultrasound in 6-12 weeks is recommended,  preferably during the week following the patient's normal menses.  Given the large left adnexal cystic lesion, intermittent torsion  would not be excluded as a cause of pain.  Electronically Signed  By: Annia Beltrew Davis M.D.  On: 12/17/2013 13:03  A/P 28 yo with a 5 cm left hemorrhagic cyst - Continue pain management with ibuprofen and roxicet - [facility] - Plan to RTC in 6-8 weeks following repeat ultrasound - Reassurance provided - Reviewed timing of intercourse with ovulation as patient was counting day 1 of her cycle as the last day of her period

## 2014-01-08 ENCOUNTER — Telehealth: Payer: Self-pay | Admitting: *Deleted

## 2014-01-08 DIAGNOSIS — N83209 Unspecified ovarian cyst, unspecified side: Secondary | ICD-10-CM

## 2014-01-08 MED ORDER — OXYCODONE-ACETAMINOPHEN 5-325 MG PO TABS: 1.0000 | ORAL_TABLET | Freq: Four times a day (QID) | ORAL | Status: DC | PRN

## 2014-01-08 NOTE — Telephone Encounter (Signed)
Patient is having increased pain from ovarian cyst and is asking for a refill of her pain medication to get her through until she can be seen for follow up in the office.  Ok to refill per Dr. Shawnie PonsPratt.  Patient will pick up RX.

## 2014-01-17 ENCOUNTER — Emergency Department (HOSPITAL_COMMUNITY): Payer: BC Managed Care – PPO

## 2014-01-17 ENCOUNTER — Emergency Department (HOSPITAL_COMMUNITY)
Admission: EM | Admit: 2014-01-17 | Discharge: 2014-01-17 | Disposition: A | Discharge status: Home / Self Care | Payer: BC Managed Care – PPO | Attending: Emergency Medicine | Admitting: Emergency Medicine

## 2014-01-17 ENCOUNTER — Encounter (HOSPITAL_COMMUNITY): Payer: Self-pay | Admitting: Emergency Medicine

## 2014-01-17 DIAGNOSIS — F410 Panic disorder [episodic paroxysmal anxiety] without agoraphobia: Secondary | ICD-10-CM | POA: Insufficient documentation

## 2014-01-17 DIAGNOSIS — F411 Generalized anxiety disorder: Secondary | ICD-10-CM | POA: Insufficient documentation

## 2014-01-17 DIAGNOSIS — Z8739 Personal history of other diseases of the musculoskeletal system and connective tissue: Secondary | ICD-10-CM | POA: Insufficient documentation

## 2014-01-17 DIAGNOSIS — R112 Nausea with vomiting, unspecified: Secondary | ICD-10-CM

## 2014-01-17 DIAGNOSIS — Z79899 Other long term (current) drug therapy: Secondary | ICD-10-CM | POA: Insufficient documentation

## 2014-01-17 DIAGNOSIS — R109 Unspecified abdominal pain: Secondary | ICD-10-CM | POA: Insufficient documentation

## 2014-01-17 DIAGNOSIS — Z87891 Personal history of nicotine dependence: Secondary | ICD-10-CM | POA: Insufficient documentation

## 2014-01-17 DIAGNOSIS — R6510 Systemic inflammatory response syndrome (SIRS) of non-infectious origin without acute organ dysfunction: Secondary | ICD-10-CM

## 2014-01-17 DIAGNOSIS — R197 Diarrhea, unspecified: Secondary | ICD-10-CM

## 2014-01-17 LAB — CBC WITH DIFFERENTIAL/PLATELET
BASOS PCT: 0 % (ref 0–1)
Basophils Absolute: 0 10*3/uL (ref 0.0–0.1)
Eosinophils Absolute: 0.1 10*3/uL (ref 0.0–0.7)
Eosinophils Relative: 1 % (ref 0–5)
HCT: 45.1 % (ref 36.0–46.0)
HEMOGLOBIN: 15.5 g/dL — AB (ref 12.0–15.0)
LYMPHS PCT: 2 % — AB (ref 12–46)
Lymphs Abs: 0.4 10*3/uL — ABNORMAL LOW (ref 0.7–4.0)
MCH: 29 pg (ref 26.0–34.0)
MCHC: 34.4 g/dL (ref 30.0–36.0)
MCV: 84.3 fL (ref 78.0–100.0)
MONO ABS: 0.8 10*3/uL (ref 0.1–1.0)
Monocytes Relative: 4 % (ref 3–12)
Neutro Abs: 16.1 10*3/uL — ABNORMAL HIGH (ref 1.7–7.7)
Neutrophils Relative %: 93 % — ABNORMAL HIGH (ref 43–77)
Platelets: 303 10*3/uL (ref 150–400)
RBC: 5.35 MIL/uL — ABNORMAL HIGH (ref 3.87–5.11)
RDW: 13.7 % (ref 11.5–15.5)
WBC: 17.3 10*3/uL — ABNORMAL HIGH (ref 4.0–10.5)

## 2014-01-17 LAB — URINE MICROSCOPIC-ADD ON

## 2014-01-17 LAB — COMPREHENSIVE METABOLIC PANEL
ALK PHOS: 43 U/L (ref 39–117)
ALT: 11 U/L (ref 0–35)
AST: 10 U/L (ref 0–37)
Albumin: 4.3 g/dL (ref 3.5–5.2)
BILIRUBIN TOTAL: 0.6 mg/dL (ref 0.3–1.2)
BUN: 9 mg/dL (ref 6–23)
CHLORIDE: 104 meq/L (ref 96–112)
CO2: 19 meq/L (ref 19–32)
CREATININE: 0.51 mg/dL (ref 0.50–1.10)
Calcium: 9.4 mg/dL (ref 8.4–10.5)
GLUCOSE: 127 mg/dL — AB (ref 70–99)
Potassium: 3.7 mEq/L (ref 3.7–5.3)
Sodium: 140 mEq/L (ref 137–147)
Total Protein: 7.5 g/dL (ref 6.0–8.3)

## 2014-01-17 LAB — URINALYSIS, ROUTINE W REFLEX MICROSCOPIC
Glucose, UA: NEGATIVE mg/dL
Hgb urine dipstick: NEGATIVE
KETONES UR: 40 mg/dL — AB
Nitrite: NEGATIVE
PROTEIN: NEGATIVE mg/dL
Specific Gravity, Urine: 1.025 (ref 1.005–1.030)
UROBILINOGEN UA: 0.2 mg/dL (ref 0.0–1.0)
pH: 5 (ref 5.0–8.0)

## 2014-01-17 LAB — LIPASE, BLOOD: Lipase: 16 U/L (ref 11–59)

## 2014-01-17 LAB — POCT PREGNANCY, URINE: Preg Test, Ur: NEGATIVE

## 2014-01-17 MED ORDER — SODIUM CHLORIDE 0.9 % IV BOLUS (SEPSIS)
1000.0000 mL | Freq: Once | INTRAVENOUS | Status: AC
  Administered 2014-01-17: 1000 mL via INTRAVENOUS

## 2014-01-17 MED ORDER — SODIUM CHLORIDE 0.9 % IV SOLN
Freq: Once | INTRAVENOUS | Status: AC
  Administered 2014-01-17: 1000 mL/h via INTRAVENOUS

## 2014-01-17 MED ORDER — ONDANSETRON HCL 4 MG PO TABS: 4.0000 mg | ORAL_TABLET | Freq: Four times a day (QID) | ORAL | Status: DC

## 2014-01-17 MED ORDER — PROMETHAZINE HCL 25 MG/ML IJ SOLN
12.5000 mg | Freq: Once | INTRAMUSCULAR | Status: AC
  Administered 2014-01-17: 12.5 mg via INTRAVENOUS
  Filled 2014-01-17: qty 1

## 2014-01-17 MED ORDER — GI COCKTAIL ~~LOC~~
30.0000 mL | Freq: Once | ORAL | Status: AC
  Administered 2014-01-17: 30 mL via ORAL
  Filled 2014-01-17: qty 30

## 2014-01-17 MED ORDER — MORPHINE SULFATE 4 MG/ML IJ SOLN
4.0000 mg | Freq: Once | INTRAMUSCULAR | Status: AC
  Administered 2014-01-17: 4 mg via INTRAVENOUS
  Filled 2014-01-17: qty 1

## 2014-01-17 MED ORDER — ONDANSETRON HCL 4 MG/2ML IJ SOLN
4.0000 mg | Freq: Once | INTRAMUSCULAR | Status: AC
  Administered 2014-01-17: 4 mg via INTRAVENOUS
  Filled 2014-01-17: qty 2

## 2014-01-17 MED ORDER — FAMOTIDINE IN NACL 20-0.9 MG/50ML-% IV SOLN
20.0000 mg | Freq: Once | INTRAVENOUS | Status: AC
  Administered 2014-01-17: 20 mg via INTRAVENOUS
  Filled 2014-01-17: qty 50

## 2014-01-17 MED ORDER — DICYCLOMINE HCL 10 MG/ML IM SOLN
20.0000 mg | Freq: Once | INTRAMUSCULAR | Status: AC
  Administered 2014-01-17: 20 mg via INTRAMUSCULAR
  Filled 2014-01-17: qty 2

## 2014-01-17 MED ORDER — DIPHENHYDRAMINE HCL 50 MG/ML IJ SOLN
25.0000 mg | Freq: Once | INTRAMUSCULAR | Status: DC
  Filled 2014-01-17: qty 1

## 2014-01-17 MED ORDER — DICYCLOMINE HCL 20 MG PO TABS: 20.0000 mg | ORAL_TABLET | Freq: Two times a day (BID) | ORAL | Status: DC

## 2014-01-17 MED ORDER — IOHEXOL 300 MG/ML  SOLN
25.0000 mL | Freq: Once | INTRAMUSCULAR | Status: AC | PRN
  Administered 2014-01-17: 25 mL via ORAL

## 2014-01-17 MED ORDER — IOHEXOL 300 MG/ML  SOLN
80.0000 mL | Freq: Once | INTRAMUSCULAR | Status: AC | PRN
  Administered 2014-01-17: 80 mL via INTRAVENOUS

## 2014-01-17 NOTE — ED Notes (Signed)
CT called

## 2014-01-17 NOTE — ED Provider Notes (Addendum)
CSN: [id number]     Arrival date & time 01/17/14  0419 History   First MD Initiated Contact with Patient 01/17/14 [phone]     Chief Complaint  Patient presents with  . Emesis     (Consider location/radiation/quality/duration/timing/severity/associated sxs/prior Treatment) HPI Patient is a 28 yo woman who presents with nausea and vomiting. She developed nausea yesterday afternoon and then emesis around 2100 following dinner. She has had inumerable episodes of NBNB emesis along with watery brown diarrhea. The patient denies fever. She has intermittent epigastric pain.   The patient notes that her daughter was vomiting yesterday but, did not have diarrhea. The patient has no history of abdominal surgeries. She says she has been unable to tolerate even sips of water since her sx started.   Past Medical History  Diagnosis Date  . Anxiety   . Headache(784.0)     migraines  . Seasonal allergies   . Panic attacks   . Dislocation or subluxation of shoulder 04/2013    right - recurrent  . 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 Wisdom tooth extraction    . Shoulder arthroscopy with bankart repair Right 05/25/2013    Procedure: RIGHT SHOULDER ARTHROSCOPY WITH BANKART REPAIR;  Surgeon: Johnny Bridge, MD;  Location: [facility];  Service: Orthopedics;  Laterality: Right;   Family History  Problem Relation Age of Onset  . Cancer Mother     uterine  . Depression Mother   . Anesthesia problems Mother     hard to wake up post-op  . Heart disease Father   . Heart disease Maternal Grandfather    History  Substance Use Topics  . Smoking status: Former Smoker -- 0.50 packs/day for 8 years    Quit date: 10/22/2011  . Smokeless tobacco: Never Used  . Alcohol Use: No     Comment: rarely   OB History   Grav Para Term Preterm Abortions TAB SAB Ect Mult Living   _0 0 1 0 1 0 0 1     Review of Systems  10 point ROS  performed and is negative with the exception of sx noted above.     Allergies  Review of patient's allergies indicates no known allergies.  Home Medications   Current Outpatient Rx  Name  Route  Sig  Dispense  Refill  . ALPRAZolam (XANAX) 1 MG tablet   Oral   Take 1 mg by mouth at bedtime as needed for sleep.         . norgestimate-ethinyl estradiol (ORTHO-CYCLEN,SPRINTEC,PREVIFEM) 0.25-35 MG-MCG tablet   Oral   Take 1 tablet by mouth daily.   1 Package   11   . oxyCODONE-acetaminophen (ROXICET) 5-325 MG per tablet   Oral   Take 1 tablet by mouth every 6 (six) hours as needed for severe pain.   30 tablet   0   . SUMAtriptan (IMITREX) 100 MG tablet   Oral   Take 1 tablet (100 mg total) by mouth once as needed for migraine.   9 tablet   11    BP 126/82  Pulse 112  Temp(Src) 98.7 F (37.1 C) (Oral)  Resp 18  SpO2 96%  LMP 12/12/2013 Physical Exam  Constitutional: She is oriented to person, place, and time. She appears well-developed and well-nourished.  HENT:  Head: Normocephalic and atraumatic.  Nose: Nose normal.  Mouth/Throat: Oropharynx is clear and moist.  Eyes: Conjunctivae and  EOM are normal. Pupils are equal, round, and reactive to light. Scleral icterus is present.  Neck: Neck supple.  Cardiovascular: Regular rhythm, normal heart sounds and intact distal pulses.   Rapid and regular rate. 112 bpm  Pulmonary/Chest: Effort normal and breath sounds normal. No respiratory distress. She has no wheezes. She has no rales. She exhibits no tenderness.  Abdominal: Soft. She exhibits no distension and no mass. There is no rebound and no guarding.  The abdomen is mildly and diffusely tender.   Musculoskeletal: She exhibits no edema.  Neurological: She is alert and oriented to person, place, and time. She has normal reflexes. No cranial nerve deficit.  Skin: Skin is warm and dry.      ED Course  Procedures (including critical care time) Labs Review  Results  for orders placed during the hospital encounter of 01/17/14 (from the past 48 hour(s))  CBC WITH DIFFERENTIAL     Status: Abnormal   Collection Time    01/17/14  4:33 AM      Result Value Ref Range   WBC 17.3 (*) 4.0 - 10.5 K/uL   RBC 5.35 (*) 3.87 - 5.11 MIL/uL   Hemoglobin 15.5 (*) 12.0 - 15.0 g/dL   HCT 45.1  36.0 - 46.0 %   MCV 84.3  78.0 - 100.0 fL   MCH 29.0  26.0 - 34.0 pg   MCHC 34.4  30.0 - 36.0 g/dL   RDW 13.7  11.5 - 15.5 %   Platelets 303  150 - 400 K/uL   Neutrophils Relative % 93 (*) 43 - 77 %   Neutro Abs 16.1 (*) 1.7 - 7.7 K/uL   Lymphocytes Relative 2 (*) 12 - 46 %   Lymphs Abs 0.4 (*) 0.7 - 4.0 K/uL   Monocytes Relative 4  3 - 12 %   Monocytes Absolute 0.8  0.1 - 1.0 K/uL   Eosinophils Relative 1  0 - 5 %   Eosinophils Absolute 0.1  0.0 - 0.7 K/uL   Basophils Relative 0  0 - 1 %   Basophils Absolute 0.0  0.0 - 0.1 K/uL  COMPREHENSIVE METABOLIC PANEL     Status: Abnormal   Collection Time    01/17/14  4:33 AM      Result Value Ref Range   Sodium 140  137 - 147 mEq/L   Potassium 3.7  3.7 - 5.3 mEq/L   Chloride 104  96 - 112 mEq/L   CO2 19  19 - 32 mEq/L   Glucose, Bld 127 (*) 70 - 99 mg/dL   BUN 9  6 - 23 mg/dL   Creatinine, Ser 0.51  0.50 - 1.10 mg/dL   Calcium 9.4  8.4 - 10.5 mg/dL   Total Protein 7.5  6.0 - 8.3 g/dL   Albumin 4.3  3.5 - 5.2 g/dL   AST 10  0 - 37 U/L   ALT 11  0 - 35 U/L   Alkaline Phosphatase 43  39 - 117 U/L   Total Bilirubin 0.6  0.3 - 1.2 mg/dL   GFR calc non Af Amer >90  >90 mL/min   GFR calc Af Amer >90  >90 mL/min   Comment: (NOTE)     The eGFR has been calculated using the CKD EPI equation.     This calculation has not been validated in all clinical situations.     eGFR's persistently <90 mL/min signify possible Chronic Kidney     Disease.  LIPASE, BLOOD  Status: None   Collection Time    01/17/14  4:33 AM      Result Value Ref Range   Lipase 16  11 - 59 U/L  URINALYSIS, ROUTINE W REFLEX MICROSCOPIC     Status:  Abnormal   Collection Time    01/17/14  6:04 AM      Result Value Ref Range   Color, Urine [place] (*) YELLOW   Comment: BIOCHEMICALS MAY BE AFFECTED BY COLOR   APPearance CLOUDY (*) CLEAR   Specific Gravity, Urine 1.025  1.005 - 1.030   pH 5.0  5.0 - 8.0   Glucose, UA NEGATIVE  NEGATIVE mg/dL   Hgb urine dipstick NEGATIVE  NEGATIVE   Bilirubin Urine SMALL (*) NEGATIVE   Ketones, ur 40 (*) NEGATIVE mg/dL   Protein, ur NEGATIVE  NEGATIVE mg/dL   Urobilinogen, UA 0.2  0.0 - 1.0 mg/dL   Nitrite NEGATIVE  NEGATIVE   Leukocytes, UA SMALL (*) NEGATIVE  URINE MICROSCOPIC-ADD ON     Status: Abnormal   Collection Time    01/17/14  6:04 AM      Result Value Ref Range   Squamous Epithelial / LPF MANY (*) RARE   WBC, UA 3-6  <3 WBC/hpf   Bacteria, UA FEW (*) RARE  POCT PREGNANCY, URINE     Status: None   Collection Time    01/17/14  6:07 AM      Result Value Ref Range   Preg Test, Ur NEGATIVE  NEGATIVE   Comment:            THE SENSITIVITY OF THIS     METHODOLOGY IS >24 mIU/mL    MDM   0615: Labs notable for marked leukocytosis with left shift. Upon re-exam, the patient is rocking back and forth in bed complaining of continued abdominal pain which she now localizes to the lower abdomen. She has ttp over McBurnye's point. CT a/p ordered to rule out appendicitis. I anticipate that this study will be negative and the patient will be stable for discharge with plan for symptomatic management and outpatient f/u.   0755: Case signed out to Dr. Vanita Panda at change of shift. He will follow up on results of CT abd/pelvis and disposition accordingly.   Elyn Peers, MD 01/17/14 5015  Elyn Peers, MD 02/05/14 [phone]

## 2014-01-17 NOTE — ED Notes (Signed)
Pt given ginger ale for po challenge 

## 2014-01-17 NOTE — ED Notes (Signed)
Redness reduced.  Pt does not want benadryl at this time.  Continues to deny sob.

## 2014-01-17 NOTE — ED Notes (Signed)
Pt states numbing worked for esophagus, but not stomach.

## 2014-01-17 NOTE — ED Notes (Signed)
Pt states N/VD since noon, hot than chills, body aches. Per pt multiple episodes to numerous to count.

## 2014-01-17 NOTE — ED Notes (Signed)
Pt with local reaction to morphine.  R arm red, though no hives or sob.

## 2014-01-17 NOTE — ED Notes (Signed)
Manly MD at bedside. 
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
## 2014-01-17 NOTE — ED Provider Notes (Signed)
11:31 AM Patient tolerating  PO intake.  VS improved.  CT reviewed w patient - no acute findings.  D/C home.   Beverly Munchobert [name], MD 01/17/14 1131

## 2014-01-17 NOTE — ED Notes (Signed)
Pt back from CT c/o increasing abdominal pain.  Dr Lavella LemonsManly notified.

## 2014-01-18 ENCOUNTER — Ambulatory Visit (HOSPITAL_COMMUNITY): Payer: BC Managed Care – PPO

## 2014-01-28 ENCOUNTER — Ambulatory Visit (HOSPITAL_COMMUNITY): Payer: BC Managed Care – PPO

## 2014-02-12 DIAGNOSIS — F411 Generalized anxiety disorder: Secondary | ICD-10-CM | POA: Insufficient documentation

## 2014-02-13 ENCOUNTER — Telehealth: Payer: Self-pay

## 2014-02-13 NOTE — Telephone Encounter (Signed)
Left message for call back Non identifiable  Pap--11/2011--neg Flu vaccine--08/2013

## 2014-02-14 ENCOUNTER — Encounter: Payer: Self-pay | Admitting: Family Medicine

## 2014-02-14 ENCOUNTER — Ambulatory Visit (INDEPENDENT_AMBULATORY_CARE_PROVIDER_SITE_OTHER): Payer: BC Managed Care – PPO | Admitting: Family Medicine

## 2014-02-14 VITALS — BP 112/78 | HR 70 | Temp 98.4°F | Resp 16 | Ht 64.0 in | Wt 138.2 lb

## 2014-02-14 DIAGNOSIS — F4310 Post-traumatic stress disorder, unspecified: Secondary | ICD-10-CM | POA: Insufficient documentation

## 2014-02-14 DIAGNOSIS — J329 Chronic sinusitis, unspecified: Secondary | ICD-10-CM

## 2014-02-14 DIAGNOSIS — F411 Generalized anxiety disorder: Secondary | ICD-10-CM | POA: Insufficient documentation

## 2014-02-14 DIAGNOSIS — IMO0002 Reserved for concepts with insufficient information to code with codable children: Secondary | ICD-10-CM | POA: Insufficient documentation

## 2014-02-14 DIAGNOSIS — T7411XA Adult physical abuse, confirmed, initial encounter: Secondary | ICD-10-CM

## 2014-02-14 MED ORDER — AMOXICILLIN 875 MG PO TABS: 875.0000 mg | ORAL_TABLET | Freq: Two times a day (BID) | ORAL | Status: DC
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
## 2014-02-14 NOTE — Progress Notes (Signed)
Pre visit review using our clinic review tool, if applicable. No additional management support is needed unless otherwise documented below in the visit note. 

## 2014-02-15 NOTE — Telephone Encounter (Signed)
Unable to reach prior to visit  

## 2014-02-20 NOTE — Assessment & Plan Note (Signed)
New to provider, ongoing for pt.  Now living in a shelter w/ her daughter.  #s given for crisis hotlines as well as my CMA's direct extension in case of crisis.  Will follow closely.

## 2014-02-20 NOTE — Assessment & Plan Note (Signed)
New.  Pt's sxs and PE consistent w/ infxn.  Start abx.  Cough meds prn.  Reviewed supportive care and red flags that should prompt return.  Pt expressed understanding and is in agreement w/ plan.  

## 2014-02-20 NOTE — Assessment & Plan Note (Signed)
New to provider, ongoing for pt.  Currently seeing psych and in process of weaning off benzos.  Is in counseling.  Will follow closely.
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
## 2014-02-25 ENCOUNTER — Other Ambulatory Visit: Payer: Self-pay | Admitting: *Deleted

## 2014-02-25 DIAGNOSIS — IMO0001 Reserved for inherently not codable concepts without codable children: Secondary | ICD-10-CM

## 2014-02-25 MED ORDER — NORGESTIMATE-ETH ESTRADIOL 0.25-35 MG-MCG PO TABS: 1.0000 | ORAL_TABLET | Freq: Every day | ORAL | Status: DC

## 2014-02-25 NOTE — Telephone Encounter (Signed)
Patient is needing a refill of her birth control.

## 2014-02-25 NOTE — Telephone Encounter (Signed)
rx printed instead of going electronically.  Resent rx to pharmacy.

## 2014-03-12 ENCOUNTER — Encounter: Payer: Self-pay | Admitting: Internal Medicine

## 2014-03-12 ENCOUNTER — Ambulatory Visit (INDEPENDENT_AMBULATORY_CARE_PROVIDER_SITE_OTHER): Payer: BC Managed Care – PPO | Admitting: Internal Medicine

## 2014-03-12 VITALS — BP 106/71 | HR 73 | Temp 98.5°F | Wt 146.0 lb

## 2014-03-12 DIAGNOSIS — J40 Bronchitis, not specified as acute or chronic: Secondary | ICD-10-CM

## 2014-03-12 MED ORDER — ALBUTEROL SULFATE HFA 108 (90 BASE) MCG/ACT IN AERS: 2.0000 | INHALATION_SPRAY | Freq: Four times a day (QID) | RESPIRATORY_TRACT | Status: DC | PRN

## 2014-03-12 MED ORDER — AZITHROMYCIN 250 MG PO TABS: ORAL_TABLET | ORAL | Status: DC

## 2014-03-12 MED ORDER — PREDNISONE 10 MG PO TABS: ORAL_TABLET | ORAL | Status: DC

## 2014-03-12 NOTE — Patient Instructions (Addendum)
Rest, fluids , tylenol For cough, take Mucinex DM twice a day as needed  For wheezing: albuterol as needed Take prednisone for 5 days  Take the antibiotic as prescribed  (zithromax) only if no better in 4-5 days  Call if no better in few days Call anytime if the symptoms are severe

## 2014-03-12 NOTE — Progress Notes (Signed)
Pre visit review using our clinic review tool, if applicable. No additional management support is needed unless otherwise documented below in the visit note. 

## 2014-03-12 NOTE — Progress Notes (Signed)
Subjective:    Patient ID: [name], female    DOB: 1986-06-27, 28 y.o.   MRN: [id number]  DOS:  03/12/2014 Type of  visit: Acute visit Was seen last month with sinusitis, took amoxicillin, sinuses are improved but now for the last few days has developed cough, some difficulty breathing, greenish sputum production and wheezing. No history of asthma but in the past she had bronchitis and has used inhalers.    ROS Denies fever or chills Chest pain with cough only. Some postnasal dripping. No nausea vomiting. Denies any prolonged car trips or airplane trips. No calf pain or swelling  Past Medical History  Diagnosis Date  . Anxiety   . Headache(784.0)     migraines  . Seasonal allergies   . Panic attacks   . Dislocation or subluxation of shoulder 04/2013    right - recurrent  . 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 Wisdom tooth extraction    . Shoulder arthroscopy with bankart repair Right 05/25/2013    Procedure: RIGHT SHOULDER ARTHROSCOPY WITH BANKART REPAIR;  Surgeon: Eulas PostJoshua P Landau, MD;  Location: [facility];  Service: Orthopedics;  Laterality: Right;    History   Social History  . Marital Status: Single    Spouse Name: N/A    Number of Children: N/A  . Years of Education: N/A   Occupational History  . Not on file.   Social History Main Topics  . Smoking status: Former Smoker -- 0.50 packs/day for 8 years    Quit date: 10/22/2011  . Smokeless tobacco: Never Used  . Alcohol Use: No     Comment: rarely  . Drug Use: No  . Sexual Activity: Yes    Partners: Male    Birth Control/ Protection: OCP   Other Topics Concern  . Not on file   Social History Narrative  . No narrative on file        Medication List       This list is accurate as of: 03/12/14  5:23 PM.  Always use your most recent med list.               albuterol 108 (90 BASE) MCG/ACT inhaler  Commonly  known as:  VENTOLIN HFA  Inhale 2 puffs into the lungs every 6 (six) hours as needed for wheezing or shortness of breath.     azithromycin 250 MG tablet  Commonly known as:  ZITHROMAX Z-PAK  As directed     busPIRone 15 MG tablet  Commonly known as:  BUSPAR  Take 7.5 mg by mouth 2 (two) times daily.     carbamazepine 200 MG tablet  Commonly known as:  TEGRETOL  Take 200 mg by mouth at bedtime.     norgestimate-ethinyl estradiol 0.25-35 MG-MCG tablet  Commonly known as:  ORTHO-CYCLEN,SPRINTEC,PREVIFEM  Take 1 tablet by mouth daily.     predniSONE 10 MG tablet  Commonly known as:  DELTASONE  2 tabs a day x 5 days     SUMAtriptan 100 MG tablet  Commonly known as:  IMITREX  Take 1 tablet (100 mg total) by mouth once as needed for migraine.           Objective:   Physical Exam BP 106/71  Pulse 73  Temp(Src) 98.5 F (36.9 C)  Wt 146 lb (66.225 kg)  SpO2 96%  General -- alert, well-developed, NAD.  HEENT-- Not pale. TMs normal,  throat symmetric, no redness or discharge. Face symmetric, sinuses not tender to palpation. Nose not congested. Lungs -- normal respiratory effort, no intercostal retractions, no accessory muscle use, + large airway congestion, minimal   end expiratory wheezing.  Heart-- normal rate, regular rhythm, no murmur.  Extremities-- no pretibial edema bilaterally  Neurologic--  alert & oriented X3. Speech normal, gait normal, strength normal in all extremities. Psych-- Cognition and judgment appear intact. Cooperative with normal attention span and concentration. No anxious or depressed appearing.     Assessment & Plan:    Bronchitis with mild bronchospasm Mucinex, albuterol, prednisone. If not better will start a second round of antibiotics.

## 2014-03-19 ENCOUNTER — Ambulatory Visit: Payer: BC Managed Care – PPO | Admitting: Family Medicine

## 2014-03-19 DIAGNOSIS — N83209 Unspecified ovarian cyst, unspecified side: Secondary | ICD-10-CM

## 2014-03-28 ENCOUNTER — Encounter: Payer: Self-pay | Admitting: Family Medicine

## 2014-03-28 ENCOUNTER — Ambulatory Visit (INDEPENDENT_AMBULATORY_CARE_PROVIDER_SITE_OTHER): Payer: BC Managed Care – PPO | Admitting: Family Medicine

## 2014-03-28 VITALS — BP 130/80 | HR 93 | Temp 98.4°F | Resp 17 | Wt 146.0 lb

## 2014-03-28 DIAGNOSIS — N926 Irregular menstruation, unspecified: Secondary | ICD-10-CM

## 2014-03-28 DIAGNOSIS — F411 Generalized anxiety disorder: Secondary | ICD-10-CM

## 2014-03-28 DIAGNOSIS — F4310 Post-traumatic stress disorder, unspecified: Secondary | ICD-10-CM

## 2014-03-28 LAB — HCG, QUANTITATIVE, PREGNANCY: hCG, Beta Chain, Quant, S: 0 m[IU]/mL

## 2014-03-28 NOTE — Assessment & Plan Note (Signed)
Pt reports she doesn't need to see psych but strongly recommended given that what she has been through and the fact that she needs to be a responsible parent to her child, she needs to continue to see psych.  Will follow.

## 2014-03-28 NOTE — Assessment & Plan Note (Signed)
Pt feels that her meds are not appropriate but since I did not start them, nor do I understand why she is on Tegretol, I don't feel comfortable adjusting or changing them.  Encouraged her to discuss this w/ psych and if she really feels that they're not willing to work w/ her, names and #s were given to establish w/ new provider.

## 2014-03-28 NOTE — Progress Notes (Signed)
Pre visit review using our clinic review tool, if applicable. No additional management support is needed unless otherwise documented below in the visit note. 

## 2014-03-28 NOTE — Progress Notes (Signed)
   Subjective:    Patient ID: [name], female    DOB: 1986-04-03, 28 y.o.   MRN: [id number]  HPI PTSD- 'my medications are just not working'.  Doesn't feel Buspar is effective for anxiety.  Carbamazepine is not helping w/ sleep (which is what pt reports it was given for).  Feels med is making her 'more moody'.  Pt reports she is unable to take SSRIs- 'they make me crazy'.  [facility] provider [facility].  Last seen 4/14.  Pt reports psych took her off xanax but other doctor wrote Klonopin.  Missed period- was supposed to have period last week but missed it.  Boobs are painful and swollen.  Still on OCPs.  Called GYN and was told to keep taking pills.   Review of Systems For ROS see HPI     Objective:   Physical Exam  Vitals reviewed. Constitutional: She appears well-developed and well-nourished. No distress.  Neurological: She is alert.  Difficulty w/ short term memory/recall  Skin: Skin is warm and dry.  Psychiatric:  Flat, almost detached today          Assessment & Plan:

## 2014-03-28 NOTE — Patient Instructions (Signed)
We'll notify you of your lab results You HAVE to discuss your meds with your psychiatrist- the are the ones best suited to adjust your meds If you want to have a different psychiatrist- please use the list of names and numbers Call with any questions or concerns Hang in there!!

## 2014-03-28 NOTE — Assessment & Plan Note (Signed)
New.  Pt is on OCPs and reports she was told to continue taking them.  Check Beta HCG b/c pt will need medication adjustments if pregnant.

## 2014-03-29 ENCOUNTER — Telehealth: Payer: Self-pay | Admitting: Family Medicine

## 2014-03-29 NOTE — Telephone Encounter (Signed)
Caller name:Jyssica Morrone Relation to WU:[id number]:patient Call back number:[phone] Pharmacy:  Reason for call:  To see if her lab results were back yet. Please advise

## 2014-03-29 NOTE — Telephone Encounter (Signed)
[facility] were [facility]. Called pt and advised the results.

## 2014-04-26 ENCOUNTER — Ambulatory Visit: Payer: BC Managed Care – PPO | Admitting: Family Medicine

## 2014-04-26 DIAGNOSIS — Z0289 Encounter for other administrative examinations: Secondary | ICD-10-CM

## 2014-05-02 ENCOUNTER — Telehealth: Payer: Self-pay

## 2014-05-02 ENCOUNTER — Encounter: Payer: Self-pay | Admitting: Family Medicine

## 2014-05-02 ENCOUNTER — Ambulatory Visit (INDEPENDENT_AMBULATORY_CARE_PROVIDER_SITE_OTHER): Payer: BC Managed Care – PPO | Admitting: Family Medicine

## 2014-05-02 VITALS — BP 126/94 | HR 126 | Temp 98.7°F | Resp 16 | Wt 149.1 lb

## 2014-05-02 DIAGNOSIS — F411 Generalized anxiety disorder: Secondary | ICD-10-CM

## 2014-05-02 DIAGNOSIS — G43909 Migraine, unspecified, not intractable, without status migrainosus: Secondary | ICD-10-CM

## 2014-05-02 DIAGNOSIS — R000 Tachycardia, unspecified: Secondary | ICD-10-CM

## 2014-05-02 DIAGNOSIS — Z820 Family history of epilepsy and other diseases of the nervous system: Secondary | ICD-10-CM

## 2014-05-02 MED ORDER — SUMATRIPTAN SUCCINATE 100 MG PO TABS: 100.0000 mg | ORAL_TABLET | Freq: Once | ORAL | Status: DC | PRN

## 2014-05-02 MED ORDER — PROPRANOLOL HCL ER 80 MG PO CP24: 80.0000 mg | ORAL_CAPSULE | Freq: Every day | ORAL | Status: DC

## 2014-05-02 NOTE — Progress Notes (Signed)
Pre visit review using our clinic review tool, if applicable. No additional management support is needed unless otherwise documented below in the visit note. 

## 2014-05-02 NOTE — Assessment & Plan Note (Signed)
Pt's HR was 126 on arrival and declined to 91 by end of visit.  Will start propranolol for migraine prophylaxis, HR control, and anxiety.  Will follow closely.

## 2014-05-02 NOTE — Telephone Encounter (Signed)
Left message for call back Non identifiable  

## 2014-05-02 NOTE — Progress Notes (Signed)
   Subjective:    Patient ID: [name], female    DOB: 02-24-1986, 28 y.o.   MRN: [id number]  HPI Anxiety- pt doesn't feel sxs are 'at all under control'.  Having difficulty w/ current psychiatrist- Dr Sandria Manly at [facility].  Pt reports 'they don't listen and they put me on seizure medication when I don't have seizures'.  Pt w/ elevated HR today, reports 'that's anxiety'.  Denies CP, SOB.  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  Pt reports R hand is shaking.  'i want to be tested'.  Migraines- chronic problem, on imitrex but pt ran out 2-3 days ago.  Reports no relief w/ med even when taking it (but asking for refill)   Review of Systems For ROS see HPI     Objective:   Physical Exam  Vitals reviewed. Constitutional: She is oriented to person, place, and time. She appears well-developed and well-nourished. No distress.  HENT:  Head: Normocephalic and atraumatic.  Eyes: Conjunctivae and EOM are normal. Pupils are equal, round, and reactive to light.  Neck: Normal range of motion. Neck supple.  Cardiovascular: Normal heart sounds and intact distal pulses.   No murmur heard. Tachy but regular- by end of visit HR had decreased to 91  Pulmonary/Chest: Effort normal and breath sounds normal. No respiratory distress. She has no wheezes. She has no rales.  Musculoskeletal: She exhibits no edema.  Lymphadenopathy:    She has no cervical adenopathy.  Neurological: She is alert and oriented to person, place, and time. Coordination normal.  No tremor noted on exam today No gait abnormality  Skin: Skin is warm and dry.  Psychiatric:  Flat, withdrawn          Assessment & Plan:

## 2014-05-02 NOTE — Patient Instructions (Signed)
Follow up in 1 month to recheck heart rate and anxiety Please call and schedule an appt with a new psychiatrist We'll call you with your Neurology appt I refilled your Imitrex for the migraines Start the Propranolol daily to help migraines, anxiety, and rapid heart rate Call with any questions or concerns Hang in there!!

## 2014-05-02 NOTE — Telephone Encounter (Addendum)
Needs to get in to see a neurologist.  Have been having hand tremors--worse in right hand, frequent migraines--recently had a migraine that lasted 7 days (started 5/14 and ended on 5/21)--was not relieved by Imitrex, intermittent muscle spasms in arms and legs, and changes in coordination and blurry vision--usually with mirgraines.   Denies unilateral weakness.  Slow, slurred speech noted, but pt stated she just got up. States she's been sleeping a lot more lately due to migraines.  She has been taking Imitrex for migraines, but is currently out.  She was also recently started on a seizure medication (Keppra 250 mg PO BID).  She took the medication for 3 weeks and stopped taking it because it was making her migraines worse (last dose: 04/25/14).   Has also been taking Buspar.  States that she does not take the medication regularly.  Takes only when she thinks she needs it.  States that Buspar seems to be making her anxiety worse.  She would also like to see one of our counselors/psychiatrist if possible.    Wanted to discuss ataxia six, because she has a family history--maternal grandfather and 7 of his sisters and some of their children have it.

## 2014-05-02 NOTE — Assessment & Plan Note (Signed)
Pt reports that maternal grandfather and 7 siblings all have ataxia 6.  Pt now worried about a R hand tremor (not present on PE today).  Would like to be evaluated by neuro.  Referral made.

## 2014-05-02 NOTE — Assessment & Plan Note (Signed)
Chronic problem.  Pt has been seeing psych but reports 'they don't listen'.  Would like a new provider- names and #s given.  Unclear as to why pt is on Keppra- reports psych started this for anxiety- but she stopped it 2 weeks ago b/c it wasn't working.  Will refer to neuro for migraine evaluation and ataxia 6 evaluation- hopefully they will be able to shed some light on pt's use of seizure medication since pt isn't able to tell me and I have no records to review.

## 2014-05-02 NOTE — Telephone Encounter (Signed)
FYI, our 1:15 pm, I had Ashlee call and triage ahead of time.

## 2014-05-02 NOTE — Assessment & Plan Note (Signed)
Chronic problem for pt.  Not well controlled.  Imitrex refilled at pt's request.  Will refer to neuro for evaluation and tx.  Reviewed supportive care and red flags that should prompt return.  Pt expressed understanding and is in agreement w/ plan.

## 2014-05-08 DIAGNOSIS — F1010 Alcohol abuse, uncomplicated: Secondary | ICD-10-CM | POA: Insufficient documentation

## 2014-05-10 ENCOUNTER — Encounter: Payer: Self-pay | Admitting: Neurology

## 2014-05-10 ENCOUNTER — Ambulatory Visit (INDEPENDENT_AMBULATORY_CARE_PROVIDER_SITE_OTHER): Payer: BC Managed Care – PPO | Admitting: Neurology

## 2014-05-10 VITALS — BP 96/60 | HR 60 | Resp 14 | Ht 64.0 in | Wt 147.0 lb

## 2014-05-10 DIAGNOSIS — G43709 Chronic migraine without aura, not intractable, without status migrainosus: Secondary | ICD-10-CM

## 2014-05-10 DIAGNOSIS — G43109 Migraine with aura, not intractable, without status migrainosus: Secondary | ICD-10-CM

## 2014-05-10 DIAGNOSIS — G4440 Drug-induced headache, not elsewhere classified, not intractable: Secondary | ICD-10-CM

## 2014-05-10 DIAGNOSIS — IMO0002 Reserved for concepts with insufficient information to code with codable children: Secondary | ICD-10-CM

## 2014-05-10 NOTE — Progress Notes (Signed)
NEUROLOGY FOLLOW UP OFFICE NOTE  Beverly Areamber Mitnick [id number]  HISTORY OF PRESENT ILLNESS: [name] is a 28 year old right-handed woman with history of migraine, vertigo and anxiety who presents for migraine and tremor.  Records and images personally reviewed.  Onset:  28 years old Location:  Starts back of head (spreads up to the eyes, bilaterally when severe) Quality:  Pounding, (severe ones also shooting, feels like "head split open") Intensity:  Mild 4/10; severe 10/10 Aura:  Scotomas, wavy lines Prodrome:  no Associated symptoms:  Photophobia (severe ones associated with nausea, photophobia, phonophobia, osmophobia, hyperesthesia) Duration:  Mild 6 hours; severe 3-5 days Frequency:  Mild ones are daily; severe ones occur once a month (3-5 headache days/month) Triggers/exacerbating factors:  Bright light, chinese food, hot dogs, pork, touching sensitive spot on top of head, pulling hair back tight Relieving factors:  sleep Activity:  Cannot function with severe ones  Past abortive therapy:  none Past preventative therapy:  none  Current abortive therapy:  Ibuprofen 800mg  (helps incompletely; takes 3-4 days/week), tylenol with Coke (helps sometimes), sumatriptan 100mg  (sometimes makes headache worse; takes 2 times a month) Current preventative therapy:  Propranolol ER 80mg  (prescribed on 05/02/14)  Caffeine:  1 cup coffee or 1 can soda daily Alcohol:  no Smoker:  occasionally Diet:  poor Exercise:  Tries to work Depression/stress:  Stress due to taking care of young daughter.  Also having problems with her [id number] year old stepdaughter Sleep hygiene:  Poor until recently (prescribed Ambien) Family history of headache:  Aunt Family history of spinocerebellar ataxia (maternal grandfather and his 7 siblings, her maternal aunt)  08/14/12 MRI BRAIN W/WO:  normal.  No cerebellar atrophy.  PAST MEDICAL HISTORY: Past Medical History  Diagnosis Date  . Anxiety   . Headache(784.0)    migraines  . Seasonal allergies   . Panic attacks   . Dislocation or subluxation of shoulder 04/2013    right - recurrent  . 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 propranolol ER (INDERAL LA) 80 MG 24 hr capsule Take 1 capsule (80 mg total) by mouth daily.  30 capsule  3  . SUMAtriptan (IMITREX) 100 MG tablet Take 1 tablet (100 mg total) by mouth once as needed for migraine.  10 tablet  11  . albuterol (VENTOLIN HFA) 108 (90 BASE) MCG/ACT inhaler Inhale 2 puffs into the lungs every 6 (six) hours as needed for wheezing or shortness of breath.  1 Inhaler  1   No current facility-administered medications on file prior to visit.    ALLERGIES: No Known Allergies  FAMILY HISTORY: Family History  Problem Relation Age of Onset  . Cancer Mother     uterine  . Depression Mother   . Anesthesia problems Mother     hard to wake up post-op  . Heart disease Father   . Heart disease Maternal Grandfather     SOCIAL HISTORY: History   Social History  . Marital Status: Single    Spouse Name: N/A    Number of Children: N/A  . Years of Education: N/A   Occupational History  . Not on file.   Social History Main Topics  . Smoking status: Former Smoker -- 0.50 packs/day for 8 years    Quit date: 10/22/2011  . Smokeless tobacco: Never Used  . Alcohol Use: No     Comment: rarely  . Drug  Use: No  . Sexual Activity: Yes    Partners: Male    Birth Control/ Protection: OCP   Other Topics Concern  . Not on file   Social History Narrative  . No narrative on file    REVIEW OF SYSTEMS: Constitutional: No fevers, chills, or sweats, no generalized fatigue, change in appetite Eyes: No visual changes, double vision, eye pain Ear, nose and throat: No hearing loss, ear pain, nasal congestion, sore throat Cardiovascular: No chest pain, palpitations Respiratory:  No shortness of  breath at rest or with exertion, wheezes GastrointestinaI: No nausea, vomiting, diarrhea, abdominal pain, fecal incontinence Genitourinary:  No dysuria, urinary retention or frequency Musculoskeletal:  No neck pain, back pain Integumentary: No rash, pruritus, skin lesions Neurological: as above Psychiatric: Insomnia, stress Endocrine: No palpitations, fatigue, diaphoresis, mood swings, change in appetite, change in weight, increased thirst Hematologic/Lymphatic:  No anemia, purpura, petechiae. Allergic/Immunologic: no itchy/runny eyes, nasal congestion, recent allergic reactions, rashes  PHYSICAL EXAM: Filed Vitals:   05/10/14 1345  BP: 96/60  Pulse: 60  Resp: 14   General: No acute distress Head:  Normocephalic/atraumatic Neck: supple, no paraspinal tenderness, full range of motion Heart:  Regular rate and rhythm Lungs:  Clear to auscultation bilaterally Back: No paraspinal tenderness Neurological Exam: alert and oriented to person, place, and time. Attention span and concentration intact, recent and remote memory intact, fund of knowledge intact.  Speech fluent and not dysarthric, language intact.  CN II-XII intact. Fundoscopic exam unremarkable without vessel changes, exudates, hemorrhages or papilledema.  Bulk and tone normal, muscle strength 5/5 throughout.  Sensation to light touch, temperature and vibration intact.  Deep tendon reflexes 2+ throughout, toes downgoing.  Finger to nose and heel to shin testing intact.  Gait normal, Romberg negative.  IMPRESSION: Chronic migraine Migraine with aura Medication-overuse headache Family history of spinocerebellar ataxia 6.  She does not exhibit symptoms of SPA on exam.  MRI from 2013 unremarkable.  PLAN: 1.  Continue propranolol ER 80mg  daily for now.  Instructed to call first week of July with update and can adjust dose if needed. 2.  For abortive therapy, try taking sumatriptan with naproxen 500mg  3.  Improve diet, exercise, stop  caffeine, increase water intake 4.  Limit use of pain relievers to no more than 2 days out of the week. 5.  Follow up in 3 months.  Shon MilletAdam Jaffe, DO  CC:  Neena RhymesKatherine Tabori, MD

## 2014-05-10 NOTE — Patient Instructions (Signed)
Migraine Recommendations: 1.  When you get a migraine, take sumatriptan with naproxen 500mg  at immediate onset of migraine. 2.  Limit use of pain relievers to no more than 2 days out of the week.  These medications include acetaminophen, ibuprofen, triptans and narcotics.  This will help reduce risk of rebound headaches. 3.  Keep a headache diary. 4.  Stay adequately hydrated. 5.  Maintain good sleep hygiene. 6.  Maintain proper stress management. 7.  Continue the Inderal LA 80mg  daily.  Call the week of the 4th of July with update and we can increase dose if needed. 8.  Improve diet and start exercising regularly 9.  Stop caffeine 10.  Follow up in 3 months.

## 2014-06-04 ENCOUNTER — Telehealth: Payer: Self-pay

## 2014-06-04 NOTE — Telephone Encounter (Signed)
Medication and allergies:  Reviewed and updated  90 day supply/mail order: n/a Local pharmacy:  [id number] - [place], [place] - [address]   Immunizations due:  Tdap   A/P: No changes to personal, family history or past surgical hx PAP- 12/02/11--negative Flu- 08/29/13 Tdap- DUE  To Discuss with Provider: Nothing at this time.

## 2014-06-05 ENCOUNTER — Encounter: Payer: Self-pay | Admitting: Family Medicine

## 2014-06-05 ENCOUNTER — Ambulatory Visit (INDEPENDENT_AMBULATORY_CARE_PROVIDER_SITE_OTHER): Payer: BC Managed Care – PPO | Admitting: Family Medicine

## 2014-06-05 VITALS — BP 108/86 | HR 77 | Temp 98.3°F | Resp 16 | Ht 63.0 in | Wt 152.2 lb

## 2014-06-05 DIAGNOSIS — Z0000 Encounter for general adult medical examination without abnormal findings: Secondary | ICD-10-CM

## 2014-06-05 HISTORY — DX: Encounter for general adult medical examination without abnormal findings: Z00.00

## 2014-06-05 LAB — CBC WITH DIFFERENTIAL/PLATELET
BASOS PCT: 0.7 % (ref 0.0–3.0)
Basophils Absolute: 0 10*3/uL (ref 0.0–0.1)
Eosinophils Absolute: 0.2 10*3/uL (ref 0.0–0.7)
Eosinophils Relative: 3.8 % (ref 0.0–5.0)
HCT: 41.4 % (ref 36.0–46.0)
Hemoglobin: 13.8 g/dL (ref 12.0–15.0)
Lymphocytes Relative: 42.1 % (ref 12.0–46.0)
Lymphs Abs: 2.7 10*3/uL (ref 0.7–4.0)
MCHC: 33.3 g/dL (ref 30.0–36.0)
MCV: 86.2 fl (ref 78.0–100.0)
MONO ABS: 0.5 10*3/uL (ref 0.1–1.0)
Monocytes Relative: 7.8 % (ref 3.0–12.0)
NEUTROS PCT: 45.6 % (ref 43.0–77.0)
Neutro Abs: 3 10*3/uL (ref 1.4–7.7)
PLATELETS: 316 10*3/uL (ref 150.0–400.0)
RBC: 4.81 Mil/uL (ref 3.87–5.11)
RDW: 14.1 % (ref 11.5–15.5)
WBC: 6.5 10*3/uL (ref 4.0–10.5)

## 2014-06-05 LAB — BASIC METABOLIC PANEL
BUN: 10 mg/dL (ref 6–23)
CHLORIDE: 103 meq/L (ref 96–112)
CO2: 25 meq/L (ref 19–32)
Calcium: 9.1 mg/dL (ref 8.4–10.5)
Creatinine, Ser: 0.5 mg/dL (ref 0.4–1.2)
GFR: 168.97 mL/min (ref >60.00)
Glucose, Bld: 111 mg/dL — ABNORMAL HIGH (ref 70–99)
Potassium: 3.4 mEq/L — ABNORMAL LOW (ref 3.5–5.1)
SODIUM: 134 meq/L — AB (ref 135–145)

## 2014-06-05 LAB — LIPID PANEL
Cholesterol: 171 mg/dL (ref 0–200)
HDL: 58.1 mg/dL (ref >39.00)
LDL Cholesterol: 87 mg/dL (ref 0–99)
NONHDL: 112.9
Total CHOL/HDL Ratio: 3
Triglycerides: 129 mg/dL (ref 0.0–149.0)
VLDL: 25.8 mg/dL (ref 0.0–40.0)

## 2014-06-05 LAB — HEPATIC FUNCTION PANEL
ALT: 13 U/L (ref 0–35)
AST: 15 U/L (ref 0–37)
Albumin: 4 g/dL (ref 3.5–5.2)
Alkaline Phosphatase: 34 U/L — ABNORMAL LOW (ref 39–117)
BILIRUBIN DIRECT: 0.1 mg/dL (ref 0.0–0.3)
BILIRUBIN TOTAL: 0.4 mg/dL (ref 0.2–1.2)
Total Protein: 7 g/dL (ref 6.0–8.3)

## 2014-06-05 LAB — VITAMIN D 25 HYDROXY (VIT D DEFICIENCY, FRACTURES): VITD: 29.63 ng/mL

## 2014-06-05 LAB — TSH: TSH: 2.24 u[IU]/mL (ref 0.35–4.50)

## 2014-06-05 NOTE — Progress Notes (Signed)
Pre visit review using our clinic review tool, if applicable. No additional management support is needed unless otherwise documented below in the visit note. 

## 2014-06-05 NOTE — Assessment & Plan Note (Signed)
Pt's PE WNL.  Due for pap next year.  Check labs.  Anticipatory guidance provided.

## 2014-06-05 NOTE — Progress Notes (Signed)
   Subjective:    Patient ID: [name], female    DOB: May 29, 1986, 28 y.o.   MRN: [id number]  HPI CPE- pt wants to defer pap until next year.   Review of Systems Patient reports no vision/ hearing changes, adenopathy,fever, weight change,  persistant/recurrent hoarseness , swallowing issues, chest pain, palpitations, edema, persistant/recurrent cough, hemoptysis, dyspnea (rest/exertional/paroxysmal nocturnal), gastrointestinal bleeding (melena, rectal bleeding), abdominal pain, significant heartburn, bowel changes, GU symptoms (dysuria, hematuria, incontinence), Gyn symptoms (abnormal  bleeding, pain),  syncope, focal weakness, memory loss, numbness & tingling, skin/hair/nail changes, abnormal bruising or bleeding, anxiety, or depression.     Objective:   Physical Exam  General Appearance:    Alert, cooperative, no distress, appears stated age  Head:    Normocephalic, without obvious abnormality, atraumatic  Eyes:    PERRL, conjunctiva/corneas clear, EOM's intact, fundi    benign, both eyes  Ears:    Normal TM's and external ear canals, both ears  Nose:   Nares normal, septum midline, mucosa normal, no drainage    or sinus tenderness  Throat:   Lips, mucosa, and tongue normal; teeth and gums normal  Neck:   Supple, symmetrical, trachea midline, no adenopathy;    Thyroid: no enlargement/tenderness/nodules  Back:     Symmetric, no curvature, ROM normal, no CVA tenderness  Lungs:     Clear to auscultation bilaterally, respirations unlabored  Chest Wall:    No tenderness or deformity   Heart:    Regular rate and rhythm, S1 and S2 normal, no murmur, rub   or gallop  Breast Exam:    No tenderness, masses, or nipple abnormality  Abdomen:     Soft, non-tender, bowel sounds active all four quadrants,    no masses, no organomegaly  Genitalia:    Deferred at pt's request  Rectal:    Extremities:   Extremities normal, atraumatic, no cyanosis or edema  Pulses:   2+ and symmetric all  extremities  Skin:   Skin color, texture, turgor normal, no rashes or lesions  Lymph nodes:   Cervical, supraclavicular, and axillary nodes normal  Neurologic:   CNII-XII intact, normal strength, sensation and reflexes    throughout          Assessment & Plan:

## 2014-06-05 NOTE — Patient Instructions (Signed)
Follow up in 1 year for your physical or as needed We'll notify you of your lab results and make any changes if needed Call with any questions or concerns Keep up the good work! Happy Early [place] OuchBirthday!!!

## 2014-06-06 ENCOUNTER — Ambulatory Visit: Payer: BC Managed Care – PPO

## 2014-06-06 DIAGNOSIS — R7309 Other abnormal glucose: Secondary | ICD-10-CM

## 2014-06-06 LAB — HEMOGLOBIN A1C: HEMOGLOBIN A1C: 5.3 % (ref 4.6–6.5)

## 2014-06-26 ENCOUNTER — Ambulatory Visit (INDEPENDENT_AMBULATORY_CARE_PROVIDER_SITE_OTHER): Payer: BC Managed Care – PPO | Admitting: Family Medicine

## 2014-06-26 VITALS — BP 96/61 | HR 75 | Temp 98.2°F | Resp 18 | Ht 63.5 in | Wt 145.2 lb

## 2014-06-26 DIAGNOSIS — E860 Dehydration: Secondary | ICD-10-CM

## 2014-06-26 DIAGNOSIS — N644 Mastodynia: Secondary | ICD-10-CM

## 2014-06-26 DIAGNOSIS — R110 Nausea: Secondary | ICD-10-CM

## 2014-06-26 DIAGNOSIS — K5289 Other specified noninfective gastroenteritis and colitis: Secondary | ICD-10-CM

## 2014-06-26 LAB — POCT CBC
GRANULOCYTE PERCENT: 76.1 % (ref 37–80)
HCT, POC: 43.6 % (ref 37.7–47.9)
Hemoglobin: 14.2 g/dL (ref 12.2–16.2)
Lymph, poc: 2.2 (ref 0.6–3.4)
MCH, POC: 28.6 pg (ref 27–31.2)
MCHC: 32.6 g/dL (ref 31.8–35.4)
MCV: 87.6 fL (ref 80–97)
MID (cbc): 0.8 (ref 0–0.9)
MPV: 7.1 fL (ref 0–99.8)
POC GRANULOCYTE: 9.7 — AB (ref 2–6.9)
POC LYMPH PERCENT: 17.4 %L (ref 10–50)
POC MID %: 6.5 %M (ref 0–12)
Platelet Count, POC: 268 10*3/uL (ref 142–424)
RBC: 4.97 M/uL (ref 4.04–5.48)
RDW, POC: 13.9 %
WBC: 12.8 10*3/uL — AB (ref 4.6–10.2)

## 2014-06-26 LAB — POCT URINALYSIS DIPSTICK
Bilirubin, UA: NEGATIVE
Glucose, UA: NEGATIVE
Ketones, UA: NEGATIVE
Leukocytes, UA: NEGATIVE
NITRITE UA: NEGATIVE
PH UA: 5.5
PROTEIN UA: NEGATIVE
RBC UA: NEGATIVE
Spec Grav, UA: 1.015
Urobilinogen, UA: 0.2

## 2014-06-26 LAB — POCT UA - MICROSCOPIC ONLY
Bacteria, U Microscopic: NEGATIVE
CASTS, UR, LPF, POC: NEGATIVE
CRYSTALS, UR, HPF, POC: NEGATIVE
Mucus, UA: NEGATIVE
Yeast, UA: NEGATIVE

## 2014-06-26 LAB — POCT URINE PREGNANCY: Preg Test, Ur: NEGATIVE

## 2014-06-26 MED ORDER — ONDANSETRON 4 MG PO TBDP
4.0000 mg | ORAL_TABLET | Freq: Once | ORAL | Status: AC
  Administered 2014-06-26: 4 mg via ORAL

## 2014-06-26 MED ORDER — ONDANSETRON 4 MG PO TBDP: 4.0000 mg | ORAL_TABLET | Freq: Three times a day (TID) | ORAL | Status: DC | PRN

## 2014-06-26 NOTE — Progress Notes (Signed)
Subjective: Patient is here complaining of diarrhea onset midnight, about 15 hours ago. She went multiple times. She felt very nauseated she did not vomit. She is only got the liquid down. Has not eaten. Menses is due soon. She had a lot of pain in her midabdomen. No fever. No one else at home is sick.  Objective: Somewhat ill-appearing. Throat clear except mucous membranes are little dry. Neck supple without nodes. Chest clear. Heart regular without murmurs. Abdomen soft. Good skin turgor. Active bowel sounds. Minimal tenderness. Results for orders placed in visit on 06/26/14  POCT URINE PREGNANCY      Result Value Ref Range   Preg Test, Ur Negative    POCT UA - MICROSCOPIC ONLY      Result Value Ref Range   WBC, Ur, HPF, POC 1-2     RBC, urine, microscopic 1-4     Bacteria, U Microscopic neg     Mucus, UA neg     Epithelial cells, urine per micros 1-3     Crystals, Ur, HPF, POC neg     Casts, Ur, LPF, POC neg     Yeast, UA neg    POCT URINALYSIS DIPSTICK      Result Value Ref Range   Color, UA yellow     Clarity, UA slightly cloudy     Glucose, UA neg     Bilirubin, UA neg     Ketones, UA neg     Spec Grav, UA 1.015     Blood, UA neg     pH, UA 5.5     Protein, UA neg     Urobilinogen, UA 0.2     Nitrite, UA neg     Leukocytes, UA Negative    POCT CBC      Result Value Ref Range   WBC 12.8 (*) 4.6 - 10.2 K/uL   Lymph, poc 2.2  0.6 - 3.4   POC LYMPH PERCENT 17.4  10 - 50 %L   MID (cbc) 0.8  0 - 0.9   POC MID % 6.5  0 - 12 %M   POC Granulocyte 9.7 (*) 2 - 6.9   Granulocyte percent 76.1  37 - 80 %G   RBC 4.97  4.04 - 5.48 M/uL   Hemoglobin 14.2  12.2 - 16.2 g/dL   HCT, POC 16.143.6  09.637.7 - 47.9 %   MCV 87.6  80 - 97 fL   MCH, POC 28.6  27 - 31.2 pg   MCHC 32.6  31.8 - 35.4 g/dL   RDW, POC 04.513.9     Platelet Count, POC 268  142 - 424 K/uL   MPV 7.1  0 - 99.8 fL   Assessment: Nausea Diarrhea Gastroenteritis Abdominal pain  Plan: Symptomatic treatment. If she is  getting worse she is going to the emergency room or return here.

## 2014-06-26 NOTE — Patient Instructions (Signed)
Drink plenty of fluids  Take the Zofran every 6 hours if needed for nausea or vomiting  If abdominal pain gets abruptly worse go to the emergency room or return here  If diarrhea persists tomorrow you can take some over-the-counter Imodium.  Return if problems

## 2014-06-30 ENCOUNTER — Emergency Department (HOSPITAL_COMMUNITY)
Admission: EM | Admit: 2014-06-30 | Discharge: 2014-07-01 | Disposition: A | Discharge status: Home / Self Care | Payer: BC Managed Care – PPO | Attending: Emergency Medicine | Admitting: Emergency Medicine

## 2014-06-30 ENCOUNTER — Emergency Department (HOSPITAL_COMMUNITY): Payer: BC Managed Care – PPO

## 2014-06-30 ENCOUNTER — Encounter (HOSPITAL_COMMUNITY): Payer: Self-pay | Admitting: Emergency Medicine

## 2014-06-30 DIAGNOSIS — M25519 Pain in unspecified shoulder: Secondary | ICD-10-CM | POA: Insufficient documentation

## 2014-06-30 DIAGNOSIS — Z9889 Other specified postprocedural states: Secondary | ICD-10-CM | POA: Insufficient documentation

## 2014-06-30 DIAGNOSIS — Z87891 Personal history of nicotine dependence: Secondary | ICD-10-CM | POA: Insufficient documentation

## 2014-06-30 DIAGNOSIS — M542 Cervicalgia: Secondary | ICD-10-CM | POA: Insufficient documentation

## 2014-06-30 DIAGNOSIS — G43909 Migraine, unspecified, not intractable, without status migrainosus: Secondary | ICD-10-CM | POA: Insufficient documentation

## 2014-06-30 DIAGNOSIS — F410 Panic disorder [episodic paroxysmal anxiety] without agoraphobia: Secondary | ICD-10-CM | POA: Insufficient documentation

## 2014-06-30 DIAGNOSIS — Z79899 Other long term (current) drug therapy: Secondary | ICD-10-CM | POA: Insufficient documentation

## 2014-06-30 MED ORDER — IBUPROFEN 800 MG PO TABS: 800.0000 mg | ORAL_TABLET | Freq: Three times a day (TID) | ORAL | Status: DC

## 2014-06-30 MED ORDER — TRAMADOL HCL 50 MG PO TABS: 50.0000 mg | ORAL_TABLET | Freq: Four times a day (QID) | ORAL | Status: DC | PRN

## 2014-06-30 MED ORDER — TRAMADOL HCL 50 MG PO TABS
50.0000 mg | ORAL_TABLET | Freq: Once | ORAL | Status: AC
  Administered 2014-07-01: 50 mg via ORAL
  Filled 2014-06-30: qty 1

## 2014-06-30 MED ORDER — KETOROLAC TROMETHAMINE 30 MG/ML IJ SOLN
30.0000 mg | Freq: Once | INTRAMUSCULAR | Status: AC
  Administered 2014-07-01: 30 mg via INTRAVENOUS
  Filled 2014-06-30: qty 1

## 2014-06-30 NOTE — ED Notes (Signed)
Pt arrived to the ED with a complaint of back pain. Pt has had chronic back pain.  Pt states that she has a c-spine injury that she is taking medication.
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
## 2014-06-30 NOTE — Discharge Instructions (Signed)
As discussed, it is important that you follow up as soon as possible with your physician and our spine specialists for continued management of your condition.  If you develop any new, or concerning changes in your condition, please return to the emergency department immediately.

## 2014-07-01 ENCOUNTER — Telehealth: Payer: Self-pay | Admitting: Neurology

## 2014-07-01 LAB — POC URINE PREG, ED: PREG TEST UR: NEGATIVE
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
## 2014-07-01 NOTE — Telephone Encounter (Signed)
Pt says she was given Toradol and Ibuprofen scripts for a C-Spine injury. She called for an appt w/ Dr. Everlena CooperJaffe but was advised his is currently out of the office. She would like to see someone else or have a nurse call back. / Sherri S.

## 2014-07-04 ENCOUNTER — Encounter: Payer: Self-pay | Admitting: Family Medicine

## 2014-07-04 ENCOUNTER — Ambulatory Visit (INDEPENDENT_AMBULATORY_CARE_PROVIDER_SITE_OTHER): Payer: BC Managed Care – PPO | Admitting: Family Medicine

## 2014-07-04 VITALS — BP 110/70 | HR 65 | Temp 98.1°F | Resp 16 | Wt 151.1 lb

## 2014-07-04 DIAGNOSIS — M4000 Postural kyphosis, site unspecified: Secondary | ICD-10-CM

## 2014-07-04 DIAGNOSIS — M40202 Unspecified kyphosis, cervical region: Secondary | ICD-10-CM

## 2014-07-04 DIAGNOSIS — K589 Irritable bowel syndrome without diarrhea: Secondary | ICD-10-CM | POA: Insufficient documentation

## 2014-07-04 MED ORDER — DICYCLOMINE HCL 20 MG PO TABS: 20.0000 mg | ORAL_TABLET | Freq: Three times a day (TID) | ORAL | Status: DC

## 2014-07-04 MED ORDER — MELOXICAM 15 MG PO TABS: 15.0000 mg | ORAL_TABLET | Freq: Every day | ORAL | Status: DC

## 2014-07-04 NOTE — Progress Notes (Signed)
   Subjective:    Patient ID: [name], female    DOB: Jul 09, 1986, 28 y.o.   MRN: [id number]  HPI Cervical kyphosis- pt had xray done at ER and they recommended MRI.  Pt has this scheduled for Monday.  Pt sees Dr Dion SaucierLandau- he performed her surgery.  Pt reported to him that she continued to have neck pain- radiating down into arm w/ numbness and tingling and that this has not been addressed.  Pt was started on ibuprofen/tramadol.  No relief w/ tramadol.  IBS- pt reports alternating bouts of diarrhea and constipation.  Pt will have intermittent severe abdominal pains.  Reports rapid dehydration due to diarrhea.   Review of Systems For ROS see HPI     Objective:   Physical Exam  Vitals reviewed. Constitutional: She is oriented to person, place, and time. She appears well-developed and well-nourished. No distress.  Cardiovascular: Normal rate, regular rhythm and normal heart sounds.   Pulmonary/Chest: Effort normal and breath sounds normal. No respiratory distress. She has no wheezes. She has no rales.  Abdominal: Soft. Bowel sounds are normal. She exhibits distension (mild). There is no tenderness. There is no rebound and no guarding.  Neurological: She is alert and oriented to person, place, and time.  Skin: Skin is warm and dry.          Assessment & Plan:

## 2014-07-04 NOTE — Patient Instructions (Signed)
Follow up as needed We'll call you with your Neurosurgery appt STOP the ibuprofen START the Mobic daily- take w/ food Use the bentyl as needed for abdominal spasm Call with any questions or concerns Hang in there!!!  Irritable Bowel Syndrome Irritable bowel syndrome (IBS) is caused by a disturbance of normal bowel function and is a common digestive disorder. You may also hear this condition called spastic colon, mucous colitis, and irritable colon. There is no cure for IBS. However, symptoms often gradually improve or disappear with a good diet, stress management, and medicine. This condition usually appears in late adolescence or early adulthood. Women develop it twice as often as men. CAUSES  After food has been digested and absorbed in the small intestine, waste material is moved into the large intestine, or colon. In the colon, water and salts are absorbed from the undigested products coming from the small intestine. The remaining residue, or fecal material, is held for elimination. Under normal circumstances, gentle, rhythmic contractions of the bowel walls push the fecal material along the colon toward the rectum. In IBS, however, these contractions are irregular and poorly coordinated. The fecal material is either retained too long, resulting in constipation, or expelled too soon, producing diarrhea. SIGNS AND SYMPTOMS  The most common symptom of IBS is abdominal pain. It is often in the lower left side of the abdomen, but it may occur anywhere in the abdomen. The pain comes from spasms of the bowel muscles happening too much and from the buildup of gas and fecal material in the colon. This pain:  Can range from sharp abdominal cramps to a dull, continuous ache.  Often worsens soon after eating.  Is often relieved by having a bowel movement or passing gas. Abdominal pain is usually accompanied by constipation, but it may also produce diarrhea. The diarrhea often occurs right after a meal  or upon waking up in the morning. The stools are often soft, watery, and flecked with mucus. Other symptoms of IBS include:  Bloating.  Loss of appetite.  Heartburn.  Backache.  Dull pain in the arms or shoulders.  Nausea.  Burping.  Vomiting.  Gas. IBS may also cause symptoms that are unrelated to the digestive system, such as:  Fatigue.  Headaches.  Anxiety.  Shortness of breath.  Trouble concentrating.  Dizziness. These symptoms tend to come and go. DIAGNOSIS  The symptoms of IBS may seem like symptoms of other, more serious digestive disorders. Your health care provider may want to perform tests to exclude these disorders.  TREATMENT Many medicines are available to help correct bowel function or relieve bowel spasms and abdominal pain. Among the medicines available are:  Laxatives for severe constipation and to help restore normal bowel habits.  Specific antidiarrheal medicines to treat severe or lasting diarrhea.  Antispasmodic agents to relieve intestinal cramps. Your health care provider may also decide to treat you with a mild tranquilizer or sedative during unusually stressful periods in your life. Your health care provider may also prescribe antidepressant medicine. The use of this medicine has been shown to reduce pain and other symptoms of IBS. Remember that if any medicine is prescribed for you, you should take it exactly as directed. Make sure your health care provider knows how well it worked for you. HOME CARE INSTRUCTIONS   Take all medicines as directed by your health care provider.  Avoid foods that are high in fat or oils, such as heavy cream, butter, frankfurters, sausage, and other fatty meats.  Avoid foods that make you go to the bathroom, such as fruit, fruit juice, and dairy products.  Cut out carbonated drinks, chewing gum, and "gassy" foods such as beans and cabbage. This may help relieve bloating and burping.  Eat foods with bran, and  drink plenty of liquids with the bran foods. This helps relieve constipation.  Keep track of what foods seem to bring on your symptoms.  Avoid emotionally charged situations or circumstances that produce anxiety.  Start or continue exercising.  Get plenty of rest and sleep. Document Released: 11/15/2005 Document Revised: 11/20/2013 Document Reviewed: 07/05/2008 [facility] Patient Information 2015 [employer] [place]. This information is not intended to replace advice given to you by your health care provider. Make sure you discuss any questions you have with your health care provider.

## 2014-07-04 NOTE — Progress Notes (Signed)
Pre visit review using our clinic review tool, if applicable. No additional management support is needed unless otherwise documented below in the visit note. 

## 2014-07-07 NOTE — Assessment & Plan Note (Addendum)
New.  Pt has MRI pending.  Is unhappy w/ current ortho provider.  Is asking for 2nd opinion from Neurosurg due to radicular pain and numbness.  Referral made.  Start mobic daily.  Will follow.

## 2014-07-07 NOTE — Assessment & Plan Note (Signed)
New.  Reviewed pt's recent labs- all normal.  Pt's reported sxs are consistent w/ IBS.  Start bentyl prn.  Reviewed dietary and lifestyle modifications.  Will follow.

## 2014-07-19 ENCOUNTER — Telehealth: Payer: Self-pay | Admitting: Family Medicine

## 2014-07-19 DIAGNOSIS — M40202 Unspecified kyphosis, cervical region: Secondary | ICD-10-CM

## 2014-07-19 NOTE — Telephone Encounter (Signed)
Please see what pt needs

## 2014-07-19 NOTE — Telephone Encounter (Signed)
No- pt's xray shows kyphosis (curve) and specifically NO degenerative changes.  If she doesn't agree w/ Dr Gerlene FeeKritzer (who is the specialist), we can [facility] for a 2nd opinion

## 2014-07-19 NOTE — Telephone Encounter (Signed)
Spoke with pt and she advised that she just left Dr. Trudee GripKritzer's office. Was advised that the MRi did not show any soft tissue damage (pt upset because this was completed without contrast). 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 Wants to know how she should proceed?

## 2014-07-19 NOTE — Telephone Encounter (Signed)
Called pt and lmovm to return my call.

## 2014-07-19 NOTE — Telephone Encounter (Signed)
Caller name: Hospital doctorAmber  Call back number:[phone]   Reason for call:  Would like to talk to Dr. Beverely Lowabori about her spine.  Would not leave any other message.

## 2014-07-19 NOTE — Telephone Encounter (Signed)
New referral placed and pt notified.

## 2014-07-25 ENCOUNTER — Other Ambulatory Visit: Payer: Self-pay | Admitting: *Deleted

## 2014-07-25 DIAGNOSIS — B379 Candidiasis, unspecified: Secondary | ICD-10-CM

## 2014-07-25 MED ORDER — FLUCONAZOLE 150 MG PO TABS: 150.0000 mg | ORAL_TABLET | Freq: Once | ORAL | Status: DC

## 2014-07-25 NOTE — Telephone Encounter (Signed)
Patient is having yeast infection.

## 2014-08-06 ENCOUNTER — Ambulatory Visit (INDEPENDENT_AMBULATORY_CARE_PROVIDER_SITE_OTHER): Payer: BC Managed Care – PPO | Admitting: Obstetrics & Gynecology

## 2014-08-06 ENCOUNTER — Encounter: Payer: Self-pay | Admitting: Obstetrics & Gynecology

## 2014-08-06 VITALS — BP 115/75 | HR 47 | Ht 64.0 in | Wt 148.2 lb

## 2014-08-06 DIAGNOSIS — Z319 Encounter for procreative management, unspecified: Secondary | ICD-10-CM | POA: Diagnosis not present

## 2014-08-06 NOTE — Progress Notes (Signed)
Patient has been off birth control for 6 months now and has not conceived.  Her periods are irregular.  She states that it took her a while to become pregnant with her daughter as well.

## 2014-08-06 NOTE — Progress Notes (Signed)
   Subjective:    Patient ID: [name], female    DOB: 1986/08/17, 28 y.o.   MRN: [id number]  HPI  56 yo MW G1P1 with a 2 yo child is here today because she has been having unprotected IC fo 6 months and wants for me to help her "speed it up" (referring to a pregnancy). She has monthly periods. Her husband is the father of her first child. It took her 3 years to conceive that child and no interventions were used.  Review of Systems     Objective:   Physical Exam        Assessment & Plan:  Desire for pregnancy. We discussed the average length of time that it takes to conceive. We also discussed her most fertile times. Reassurance given

## 2014-08-09 ENCOUNTER — Ambulatory Visit (INDEPENDENT_AMBULATORY_CARE_PROVIDER_SITE_OTHER): Payer: BC Managed Care – PPO | Admitting: Neurology

## 2014-08-09 ENCOUNTER — Encounter: Payer: Self-pay | Admitting: Neurology

## 2014-08-09 VITALS — BP 122/70 | HR 68 | Temp 98.0°F | Resp 16 | Wt 146.3 lb

## 2014-08-09 DIAGNOSIS — M79609 Pain in unspecified limb: Secondary | ICD-10-CM

## 2014-08-09 DIAGNOSIS — IMO0002 Reserved for concepts with insufficient information to code with codable children: Secondary | ICD-10-CM

## 2014-08-09 DIAGNOSIS — M4000 Postural kyphosis, site unspecified: Secondary | ICD-10-CM

## 2014-08-09 DIAGNOSIS — M79601 Pain in right arm: Secondary | ICD-10-CM

## 2014-08-09 DIAGNOSIS — R51 Headache: Secondary | ICD-10-CM

## 2014-08-09 DIAGNOSIS — R200 Anesthesia of skin: Secondary | ICD-10-CM

## 2014-08-09 DIAGNOSIS — R209 Unspecified disturbances of skin sensation: Secondary | ICD-10-CM

## 2014-08-09 DIAGNOSIS — G43709 Chronic migraine without aura, not intractable, without status migrainosus: Secondary | ICD-10-CM

## 2014-08-09 DIAGNOSIS — M40202 Unspecified kyphosis, cervical region: Secondary | ICD-10-CM

## 2014-08-09 DIAGNOSIS — G43109 Migraine with aura, not intractable, without status migrainosus: Secondary | ICD-10-CM

## 2014-08-09 MED ORDER — SUMATRIPTAN SUCCINATE 6 MG/0.5ML ~~LOC~~ SOAJ: SUBCUTANEOUS | Status: DC

## 2014-08-09 MED ORDER — PROPRANOLOL HCL ER 120 MG PO CP24: 120.0000 mg | ORAL_CAPSULE | Freq: Every day | ORAL | Status: DC

## 2014-08-09 NOTE — Progress Notes (Signed)
NEUROLOGY FOLLOW UP OFFICE NOTE  Fatema Rabe [id number]  HISTORY OF PRESENT ILLNESS: [name] is a 28 year old right-handed woman with history of migraine, vertigo and anxiety who follows up for chronic migraine.  UPDATE: She had been experiencing right lateral neck.  She describes pain from the right scapula radiating down the lateral arm and into the 4th and 5th digits of her right hand.  She also has intermittent numbness of the left hand, not triggered by movement or position.  She had a cervical spine Xray performed 07/01/14, which revealed moderate kyphosis but no significant degenerative changes.  She has been seen by Dr. Dion Saucier, an orthopedist.  Ibuprofen and tramadol were ineffective.  An MRI was reportedly done, but I do not have these scans or report.  She was told that no nerve root impingement was found and Dr. Dion Saucier recommended an EMG.  With the neck pain, she has had some worsening of her migraines. Intensity:  Mild 5-6/10, Severe 10/10 Duration:  Mild constant, severe 6 hours Frequency:  Mild constant, severe 2 days per month (every other week) Her last severe migraine a couple of weeks ago was associated with a new symptoms.  She said she was completely blind for 30 minutes.  The headache itself was similar to her typical migraines.  Current abortive therapy:  sumatriptan  + naproxen  (ineffective) Current preventative therapy: propranolol ER   HISTORY: Onset:  28 years old Location:  Starts back of head (spreads up to the eyes, bilaterally when severe) Quality:  Pounding, (severe ones also shooting, feels like "head split open") Initial intensity:  Mild 4/10; severe 10/10 Aura:  Scotomas, wavy lines Prodrome:  no Associated symptoms:  Photophobia (severe ones associated with nausea, photophobia, phonophobia, osmophobia, hyperesthesia) Initial duration:  Mild 6 hours; severe 3-5 days Initial frequency:  Mild ones are daily; severe ones occur once a  month (3-5 headache days/month) Triggers/exacerbating factors:  Bright light, chinese food, hot dogs, pork, touching sensitive spot on top of head, pulling hair back tight Relieving factors:  sleep Activity:  Cannot function with severe ones  Past abortive therapy:  Ibuprofen  (helps incompletely; takes 3-4 days/week), tylenol with Coke (helps sometimes), Excedrin (ineffective) Past preventative therapy:  none  Caffeine:  1 cup coffee or 1 can soda daily Alcohol:  no Smoker:  occasionally Diet:  poor Exercise:  Tries to work Depression/stress:  Stress due to taking care of young daughter.  Also having problems with her 3 year old stepdaughter Sleep hygiene:  Poor until recently (prescribed Ambien) Family history of headache:  Aunt Family history of spinocerebellar ataxia (maternal grandfather and his 7 siblings, her maternal aunt)  08/14/12 MRI BRAIN W/WO:  normal.  No cerebellar atrophy.  PAST MEDICAL HISTORY: Past Medical History  Diagnosis Date  . Anxiety   . Headache(784.0)     migraines  . Seasonal allergies   . Panic attacks   . Dislocation or subluxation of shoulder 04/2013    right - recurrent  . 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 Routine general medical examination at a health care facility 06/05/2014    MEDICATIONS: Current Outpatient Prescriptions on File Prior to Visit  Medication Sig Dispense Refill  . clonazePAM (KLONOPIN) 0.5 MG tablet Take 0.5 mg by mouth 2 (two) times daily.       Marland Kitchen dicyclomine (BENTYL) 20 MG tablet Take 1 tablet (20 mg total) by mouth 3 (three) times daily before meals.  60 tablet  3  .  ibuprofen (ADVIL,MOTRIN) 800 MG tablet       . meloxicam (MOBIC) 15 MG tablet Take 1 tablet (15 mg total) by mouth daily.  30 tablet  1  . zolpidem (AMBIEN) 10 MG tablet Take 10 mg by mouth at bedtime.        No current facility-administered medications on file prior to visit.    ALLERGIES: Allergies  Allergen Reactions  . Gabapentin       Per pt, "makes me crazy"    FAMILY HISTORY: Family History  Problem Relation Age of Onset  . Cancer Mother     uterine  . Depression Mother   . Anesthesia problems Mother     hard to wake up post-op  . Heart disease Father   . Heart disease Maternal Grandfather     SOCIAL HISTORY: History   Social History  . Marital Status: Single    Spouse Name: N/A    Number of Children: N/A  . Years of Education: N/A   Occupational History  . Not on file.   Social History Main Topics  . Smoking status: Current Every Day Smoker -- 0.50 packs/day for 8 years    Last Attempt to Quit: 10/22/2011  . Smokeless tobacco: Never Used     Comment: trying to quit  . Alcohol Use: No     Comment: rarely  . Drug Use: No  . Sexual Activity: Yes    Partners: Male    Birth Control/ Protection: OCP   Other Topics Concern  . Not on file   Social History Narrative  . No narrative on file    REVIEW OF SYSTEMS: Constitutional: No fevers, chills, or sweats, no generalized fatigue, change in appetite Eyes: No visual changes, double vision, eye pain Ear, nose and throat: No hearing loss, ear pain, nasal congestion, sore throat Cardiovascular: No chest pain, palpitations Respiratory:  No shortness of breath at rest or with exertion, wheezes GastrointestinaI: some abdominal discomfort Genitourinary:  No dysuria, urinary retention or frequency Musculoskeletal:  Neck pain Integumentary: No rash, pruritus, skin lesions Neurological: as above Psychiatric: No depression, insomnia, anxiety Endocrine: No palpitations, fatigue, diaphoresis, mood swings, change in appetite, change in weight, increased thirst Hematologic/Lymphatic:  No anemia, purpura, petechiae. Allergic/Immunologic: no itchy/runny eyes, nasal congestion, recent allergic reactions, rashes  PHYSICAL EXAM: Filed Vitals:   08/09/14 1446  BP: 122/70  Pulse: 68  Temp: 98 F (36.7 C)  Resp: 16   General: No acute distress Head:   Normocephalic/atraumatic Neck: supple, no paraspinal tenderness, full range of motion Heart:  Regular rate and rhythm Lungs:  Clear to auscultation bilaterally Back: No paraspinal tenderness Neurological Exam: alert and oriented to person, place, and time. Attention span and concentration intact, recent and remote memory intact, fund of knowledge intact.  Speech fluent and not dysarthric, language intact.  CN II-XII intact. Fundoscopic exam unremarkable without vessel changes, exudates, hemorrhages or papilledema.  Bulk and tone normal, muscle strength 5/5 throughout.  Reduced pinprick sensation in right 4th and 5th digits and forearm, but vibration intact.  Deep tendon reflexes 2+ throughout, toes downgoing.  Finger to nose testing intact.  Gait normal.  IMPRESSION: Migraine with aura Chronic migraine Transient vision loss in setting of migraine Cervical kyphosis Right arm pain Left hand numbness  PLAN: 1.  Increase propranolol ER to  at bedtime.  Call in 4 weeks with update. 2.  For abortive therapy, will try Imitrex  injections  3.  Will get NCV-EMG of the upper extremities for  right arm pain and numbness and left hand numbness 4.  Get notes and MRI results of the cervical spine from Dr. Dion Saucier 5.  Because she had a new symptom associated with her migraine (30 minutes of blindness), will get an MRI of the brain to make sure there is nothing else to explain it other than the migraine. 6.  Follow up in 3 months.  Shon Millet, DO  CC:  Neena Rhymes, MD

## 2014-08-09 NOTE — Patient Instructions (Addendum)
1.  I increased the propranolol to propranolol ER  at bedtime.  Call in 4 weeks with update and we can adjust dose if needed. 2.  Take the sumatriptan  injection.  Take 1 injection at earliest onset of the headache.  May repeat once in 1 hour if needed.  Do not exceed 2 injections in 24 hour period. 3.  Will get NCV-EMG of the upper extremities. 4.  Follow up in 3 months.  Will need to get MRI results and notes from Dr. Dion Saucier 5.  Since you had a new symptom with the last headache, will get an MRI of the brain without contrast to evaluate for anything else going on. [facility] 08/14/14 9:15am

## 2014-08-12 ENCOUNTER — Telehealth: Payer: Self-pay | Admitting: Family Medicine

## 2014-08-12 MED ORDER — TRAMADOL HCL 50 MG PO TABS: 50.0000 mg | ORAL_TABLET | Freq: Three times a day (TID) | ORAL | Status: DC | PRN

## 2014-08-12 NOTE — Telephone Encounter (Signed)
Caller name: Anjannette Relation to pt: self Call back number: [phone] [facility] [place]  Reason for call:   Patient states that she needs a refill of tramadol. She says that her orthopedic doctor said that any refills would have to come from her primary.

## 2014-08-12 NOTE — Telephone Encounter (Signed)
noted 

## 2014-08-12 NOTE — Telephone Encounter (Signed)
Ok for Tramadol  TID prn #60, no refills Please check on whether pt has had appt w/ Dr Noel Gerold (referral entered on 8/21)

## 2014-08-12 NOTE — Telephone Encounter (Signed)
Spoke with pt who advised that Dr. Noel Gerold is setting her up with [facility], until then she has to get meds from [place]. Pt will call and advise when appt will be.

## 2014-08-12 NOTE — Telephone Encounter (Signed)
Med filled.  

## 2014-08-12 NOTE — Telephone Encounter (Signed)
Please advise last OV 07-04-14, no record of Tramadol on her med list.

## 2014-08-13 ENCOUNTER — Other Ambulatory Visit: Payer: Self-pay | Admitting: *Deleted

## 2014-08-13 ENCOUNTER — Telehealth: Payer: Self-pay | Admitting: *Deleted

## 2014-08-13 DIAGNOSIS — R51 Headache: Secondary | ICD-10-CM

## 2014-08-13 MED ORDER — ZOLMITRIPTAN 5 MG PO TABS: ORAL_TABLET | ORAL | Status: DC

## 2014-08-13 NOTE — Telephone Encounter (Deleted)
Patient is aware of normal labs  

## 2014-08-13 NOTE — Telephone Encounter (Signed)
[facility]

## 2014-08-14 ENCOUNTER — Encounter: Payer: BC Managed Care – PPO | Admitting: Neurology

## 2014-08-14 ENCOUNTER — Ambulatory Visit (HOSPITAL_COMMUNITY): Payer: BC Managed Care – PPO

## 2014-08-14 ENCOUNTER — Other Ambulatory Visit: Payer: Self-pay | Admitting: Family Medicine

## 2014-08-14 NOTE — Telephone Encounter (Signed)
Med filled.  

## 2014-08-15 ENCOUNTER — Telehealth: Payer: Self-pay | Admitting: Neurology

## 2014-08-15 NOTE — Telephone Encounter (Signed)
Pt no showed appt 08/14/14 for EMG.   Marlane Hatcher please send no show letter / Oneita Kras.

## 2014-08-26 ENCOUNTER — Ambulatory Visit: Payer: BC Managed Care – PPO

## 2014-08-27 ENCOUNTER — Telehealth: Payer: Self-pay | Admitting: *Deleted

## 2014-08-27 NOTE — Telephone Encounter (Signed)
I talk to patient and advised her to stay on medication for the full 4 weeks and see if heaadache  it gets any better  It can take up to 4 weeks for medication to work  if it is not better in 4 weeks then we can  Adjust the dosage  Or change meds

## 2014-08-27 NOTE — Telephone Encounter (Signed)
We still need to give current dose of propranolol time (4 weeks).  At that point, we can decide what changes in medication to make.  Also, is the Imitrex injections helping at all?

## 2014-08-27 NOTE — Telephone Encounter (Signed)
Patient calls stating she is going to  bed with a headache and waking up with a headache . She is having headache more frequent  and is taking 800mg  ibuprofen as well with no relief  she has not had EMG or  MRI done

## 2014-08-28 ENCOUNTER — Other Ambulatory Visit: Payer: Self-pay | Admitting: General Practice

## 2014-08-28 ENCOUNTER — Ambulatory Visit (INDEPENDENT_AMBULATORY_CARE_PROVIDER_SITE_OTHER): Payer: BC Managed Care – PPO

## 2014-08-28 DIAGNOSIS — Z111 Encounter for screening for respiratory tuberculosis: Secondary | ICD-10-CM

## 2014-08-28 MED ORDER — MELOXICAM 15 MG PO TABS: 15.0000 mg | ORAL_TABLET | Freq: Every day | ORAL | Status: DC

## 2014-09-02 ENCOUNTER — Ambulatory Visit (INDEPENDENT_AMBULATORY_CARE_PROVIDER_SITE_OTHER): Payer: BC Managed Care – PPO | Admitting: *Deleted

## 2014-09-02 DIAGNOSIS — Z111 Encounter for screening for respiratory tuberculosis: Secondary | ICD-10-CM

## 2014-09-02 NOTE — Patient Instructions (Signed)
Pt forgot to have previous ppd read . Pt instructed to come back 09/04/14 @1600  for ppd read.

## 2014-09-03 ENCOUNTER — Telehealth: Payer: Self-pay | Admitting: Neurology

## 2014-09-03 NOTE — Telephone Encounter (Signed)
Pt moved her appt for emg to 09-04-14

## 2014-09-04 ENCOUNTER — Other Ambulatory Visit: Payer: Self-pay

## 2014-09-04 ENCOUNTER — Ambulatory Visit (HOSPITAL_BASED_OUTPATIENT_CLINIC_OR_DEPARTMENT_OTHER)
Admission: RE | Admit: 2014-09-04 | Discharge: 2014-09-04 | Disposition: A | Discharge status: Home / Self Care | Payer: BC Managed Care – PPO | Source: Ambulatory Visit | Attending: Family Medicine | Admitting: Family Medicine

## 2014-09-04 ENCOUNTER — Ambulatory Visit (INDEPENDENT_AMBULATORY_CARE_PROVIDER_SITE_OTHER): Payer: BC Managed Care – PPO | Admitting: Neurology

## 2014-09-04 DIAGNOSIS — R200 Anesthesia of skin: Secondary | ICD-10-CM

## 2014-09-04 DIAGNOSIS — R7611 Nonspecific reaction to tuberculin skin test without active tuberculosis: Secondary | ICD-10-CM

## 2014-09-04 DIAGNOSIS — R208 Other disturbances of skin sensation: Secondary | ICD-10-CM

## 2014-09-04 LAB — TB SKIN TEST
Induration: 11 mm
TB Skin Test: POSITIVE

## 2014-09-04 NOTE — Procedures (Signed)
[facility]  [address], [address]  [place], [place] [address] Tel: [phone] Fax:  [phone] Test Date:  09/04/2014  Patient: [name] DOB: 12-13-1985 Physician: Nita Sickleonika Kaydenn Mclear  Sex: Female Height: 5\' 4"  Ref Phys: Shon MilletJaffe, Adam  ID#: [id number]   Technician: Ala BentSusan Reid R. NCS T.   Patient Complaints: Patient is a 28 year old female here for evaluation of bilateral hand paresthesias and pain.  NCV & EMG Findings: Extensive electrodiagnostic testing of the right upper extremity and additional studies of the left reveals:  1. Median, ulnar, radial, and palmar sensory responses are within normal limits bilaterally. 2. Median and ulnar motor responses are within normal limits bilaterally.  3. There is no evidence of active or chronic motor axon loss changes affecting any of the tested muscles of the upper extremities. Motor unit configuration and recruitment pattern was within normal limits.  Impression: This is a normal study of the upper extremities.   In particular, there is no evidence of carpal tunnel syndrome, ulnar neuropathy, or a cervical radiculopathy affecting the upper extremities.   ___________________________ Nita Sickleonika Mc Hollen    Nerve Conduction Studies Anti Sensory Summary Table   Stim Site NR Peak (ms) Norm Peak (ms) P-T Amp (V) Norm P-T Amp  Left Median Anti Sensory (2nd Digit)  35C  Wrist    2.8 <3.3 55.2 >20  Right Median Anti Sensory (2nd Digit)  35C  Wrist    2.7 <3.3 44.9 >20  Left Radial Anti Sensory (Base 1st Digit)  35C  Wrist    2.1 <2.7 31.7 >18  Right Radial Anti Sensory (Base 1st Digit)  35C  Wrist    1.8 <2.7 24.3 >18  Left Ulnar Anti Sensory (5th Digit)  Wrist    2.4 <3.0 26.6 >18  Right Ulnar Anti Sensory (5th Digit)  35C  Wrist    2.6 <3.0 27.9 >18   Motor Summary Table   Stim Site NR Onset (ms) Norm Onset (ms) O-P Amp (mV) Norm O-P Amp Site1 Site2 Delta-0 (ms) Dist (cm) Vel (m/s) Norm Vel (m/s)  Left Median Motor  (Abd Poll Brev)  35C  Wrist    2.7 <3.9 10.8 >6 Elbow Wrist 4.5 25.0 56 >51  Elbow    7.2  10.4         Right Median Motor (Abd Poll Brev)  35C  Wrist    2.8 <3.9 9.8 >6 Elbow Wrist 4.2 24.5 58 >51  Elbow    7.0  9.7         Left Ulnar Motor (Abd Dig Minimi)  35C  Wrist    2.1 <3.0 11.0 >8 B Elbow Wrist 3.4 21.0 62 >51  B Elbow    5.5  10.7  A Elbow B Elbow 1.5 10.0 67 >51  A Elbow    7.0  10.2         Right Ulnar Motor (Abd Dig Minimi)  35C  Wrist    2.1 <3.0 12.5 >8 B Elbow Wrist 3.3 20.0 61 >51  B Elbow    5.4  12.3  A Elbow B Elbow 1.8 10.0 56 >51  A Elbow    7.2  12.3          Comparison Summary Table   Stim Site NR Peak (ms) Norm Peak (ms) P-T Amp (V) Site1 Site2 Delta-P (ms) Norm Delta (ms)  Left Median/Ulnar Palm Comparison (Wrist - 8cm)  35C  Median Palm    1.7 <2.2 27.4 Median Palm Ulnar  Palm 0.1   Ulnar Palm    1.6 <2.2 19.7      Right Median/Ulnar Palm Comparison (Wrist - 8cm)  35C  Median Palm    1.7 <2.2 45.0 Median Palm Ulnar Palm 0.1   Ulnar Palm    1.6 <2.2 26.1       EMG   Side Muscle Ins Act Fibs Psw Fasc Number Recrt Dur Dur. Amp Amp. Poly Poly. Comment  Right 1stDorInt Nml Nml Nml Nml Nml Nml Nml Nml Nml Nml Nml Nml N/A  Right FlexPolLong Nml Nml Nml Nml Nml Nml Nml Nml Nml Nml Nml Nml N/A  Right Abd Poll Brev Nml Nml Nml Nml Nml Nml Nml Nml Nml Nml Nml Nml N/A  Right PronatorTeres Nml Nml Nml Nml Nml Nml Nml Nml Nml Nml Nml Nml N/A  Right Biceps Nml Nml Nml Nml Nml Nml Nml Nml Nml Nml Nml Nml N/A  Right Triceps Nml Nml Nml Nml Nml Nml Nml Nml Nml Nml Nml Nml N/A  Right Deltoid Nml Nml Nml Nml Nml Nml Nml Nml Nml Nml Nml Nml N/A  Left 1stDorInt Nml Nml Nml Nml Nml Nml Nml Nml Nml Nml Nml Nml N/A  Left Ext Indicis Nml Nml Nml Nml Nml Nml Nml Nml Nml Nml Nml Nml N/A  Left PronatorTeres Nml Nml Nml Nml Nml Nml Nml Nml Nml Nml Nml Nml N/A  Left Biceps Nml Nml Nml Nml Nml Nml Nml Nml Nml Nml Nml Nml N/A  Left Triceps Nml Nml Nml Nml Nml Nml Nml Nml Nml  Nml Nml Nml N/A  Left Deltoid Nml Nml Nml Nml Nml Nml Nml Nml Nml Nml Nml Nml N/A    Waveforms:

## 2014-09-05 ENCOUNTER — Encounter: Payer: Self-pay | Admitting: General Practice

## 2014-09-05 ENCOUNTER — Telehealth: Payer: Self-pay | Admitting: Family Medicine

## 2014-09-05 NOTE — Telephone Encounter (Signed)
Spoke to pt and notified that she can call Tammy at [phone]. To speak with her in regard to the TB test. All papers placed at front desk for pt to pick up.

## 2014-09-05 NOTE — Telephone Encounter (Signed)
Caller name: Refugio Relation to pt: self Call back number: [phone] Pharmacy:  Reason for call:   Patient is requesting a copy of TB result, chest Xray, and patient needs to have a record of her getting the flu shot. Patient would like to pick this up.

## 2014-09-09 ENCOUNTER — Other Ambulatory Visit: Payer: Self-pay | Admitting: Neurology

## 2014-09-09 MED ORDER — PROPRANOLOL HCL ER 120 MG PO CP24: 120.0000 mg | ORAL_CAPSULE | Freq: Every day | ORAL | Status: DC

## 2014-09-10 ENCOUNTER — Telehealth: Payer: Self-pay | Admitting: Neurology

## 2014-09-10 NOTE — Telephone Encounter (Signed)
Pt needs to talk to someone about medication please call [phone]

## 2014-09-13 ENCOUNTER — Telehealth: Payer: Self-pay | Admitting: *Deleted

## 2014-09-13 NOTE — Telephone Encounter (Signed)
Patient having trouble getting the Zomig refilled she would like to take the imatrix instead. Please advise Call back number [phone]

## 2014-09-13 NOTE — Telephone Encounter (Signed)
Left message for patient to contact office regarding medication.  

## 2014-09-13 NOTE — Telephone Encounter (Signed)
Patient notified

## 2014-09-13 NOTE — Telephone Encounter (Signed)
Patient is aware that Imitrex has been approved by [facility] and pharmacy will have it in stock on Monday 09/16/14

## 2014-09-13 NOTE — Telephone Encounter (Signed)
She was prescribed Zomig because the Imitrex  wasn't initially approved.  Now, it looks like it is approved.  She may pick that up.

## 2014-09-13 NOTE — Telephone Encounter (Signed)
Patient is unable to get the zomig due to denial from insurance  She is asking what else she can use for headache ? Please advise

## 2014-09-27 ENCOUNTER — Ambulatory Visit (HOSPITAL_COMMUNITY): Admission: RE | Admit: 2014-09-27 | Payer: BC Managed Care – PPO | Source: Ambulatory Visit

## 2014-09-30 ENCOUNTER — Encounter: Payer: Self-pay | Admitting: Neurology

## 2014-10-01 ENCOUNTER — Other Ambulatory Visit: Payer: Self-pay | Admitting: General Practice

## 2014-10-01 ENCOUNTER — Telehealth: Payer: Self-pay

## 2014-10-01 MED ORDER — TRAMADOL HCL 50 MG PO TABS: 50.0000 mg | ORAL_TABLET | Freq: Three times a day (TID) | ORAL | Status: DC | PRN

## 2014-10-01 NOTE — Telephone Encounter (Signed)
Ok for #60 but [facility] [place].  Pt will need to pick up script b/c this is a controlled substance

## 2014-10-01 NOTE — Telephone Encounter (Signed)
Last OV 07-04-14 Tramadol last filled 08-12-14 #60 with 0

## 2014-10-01 NOTE — Telephone Encounter (Signed)
[name] [phone] [facility] [name] [phone]  Elijah called to see if she could get her traMADol (ULTRAM) 50 MG tablet  [facility] [place]

## 2014-10-01 NOTE — Telephone Encounter (Signed)
Med filled and placed on counter for signature in the morning.

## 2014-10-04 ENCOUNTER — Telehealth: Payer: Self-pay | Admitting: *Deleted

## 2014-10-04 ENCOUNTER — Telehealth: Payer: Self-pay | Admitting: Neurology

## 2014-10-04 NOTE — Telephone Encounter (Signed)
patient is aware that she cannot get her inerral la refilleed until 10/11/14 it isto early  I also called in sumatriptan injection for her with 1 refill she is aware that this will have to have a prior [place]

## 2014-10-04 NOTE — Telephone Encounter (Signed)
Left message for patient to return call regarding medication

## 2014-10-04 NOTE — Telephone Encounter (Signed)
[phone] patient wants to talk to you about medication refill and shot approval

## 2014-10-07 ENCOUNTER — Telehealth: Payer: Self-pay | Admitting: Family Medicine

## 2014-10-07 MED ORDER — CLONAZEPAM 1 MG PO TABS: 1.0000 mg | ORAL_TABLET | Freq: Two times a day (BID) | ORAL | Status: DC | PRN

## 2014-10-07 MED ORDER — DICYCLOMINE HCL 20 MG PO TABS: 20.0000 mg | ORAL_TABLET | Freq: Three times a day (TID) | ORAL | Status: DC

## 2014-10-07 NOTE — Telephone Encounter (Signed)
Pt is requesting a refill on Klonopin.  Pt states that she has been taking 1 mg (1 whole tablet) instead of 0.5 mg (1/2 tablet) twice daily due to the way medication is written on bottle.  She said the bottle says Klonopin 1 mg tablets, take 0.5 mg (1/2 tablet) two times daily.  She said that she overlooked how the medication was suppose to be taken, and now she is out of tablets.  She denied adverse effects of taking 1 mg.    She shared that she needs a refill.  [place] currently in [place] because of social services and she's having panic attacks every day.  Last panic attack was earlier today.  Please advise.

## 2014-10-07 NOTE — Telephone Encounter (Signed)
Med filled and pt notified.  

## 2014-10-07 NOTE — Telephone Encounter (Signed)
This medication was faxed to pahrmacy. She should call and find out if it is there. It was confirmed on our end.

## 2014-10-07 NOTE — Telephone Encounter (Signed)
Ok for 1mg  BID prn, #60, no refills.  Please fax or call to pharmacy

## 2014-10-07 NOTE — Telephone Encounter (Signed)
Can you please triage?  
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
## 2014-10-07 NOTE — Telephone Encounter (Signed)
Caller name: Mauricia AreaHester, Catheryn Relation to pt: self  Call back number: [phone]   Reason for call:   Pt would like to know the status of medication refill clonazePAM (KLONOPIN) 1 MG tablet

## 2014-10-17 ENCOUNTER — Encounter: Payer: BC Managed Care – PPO | Admitting: Neurology

## 2014-11-05 ENCOUNTER — Telehealth: Payer: Self-pay

## 2014-11-05 NOTE — Telephone Encounter (Signed)
PTSD dx will come from psych.  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

## 2014-11-05 NOTE — Telephone Encounter (Signed)
[name] [phone]  Applying for [employer] and she needs proof that she has PTSD, Cervical Kythosis

## 2014-11-05 NOTE — Telephone Encounter (Signed)
Can we write this letter or does it have to go through her psych?

## 2014-11-06 NOTE — Telephone Encounter (Signed)
Called pt but could not leave a message was notified that "this voicemail has not been set up yet"

## 2014-11-06 NOTE — Telephone Encounter (Signed)
Pt called back to office and was notified.

## 2014-11-08 ENCOUNTER — Encounter: Payer: Self-pay | Admitting: Neurology

## 2014-11-08 ENCOUNTER — Ambulatory Visit (INDEPENDENT_AMBULATORY_CARE_PROVIDER_SITE_OTHER): Payer: BC Managed Care – PPO | Admitting: Neurology

## 2014-11-08 VITALS — BP 115/70 | HR 66 | Ht 64.0 in | Wt 138.0 lb

## 2014-11-08 DIAGNOSIS — G43719 Chronic migraine without aura, intractable, without status migrainosus: Secondary | ICD-10-CM

## 2014-11-08 DIAGNOSIS — Z720 Tobacco use: Secondary | ICD-10-CM

## 2014-11-08 DIAGNOSIS — F4310 Post-traumatic stress disorder, unspecified: Secondary | ICD-10-CM

## 2014-11-08 MED ORDER — TOPIRAMATE 25 MG PO TABS: 25.0000 mg | ORAL_TABLET | Freq: Every day | ORAL | Status: DC

## 2014-11-08 MED ORDER — PROPRANOLOL HCL 60 MG PO TABS: ORAL_TABLET | ORAL | Status: DC

## 2014-11-08 MED ORDER — ELETRIPTAN HYDROBROMIDE 40 MG PO TABS: ORAL_TABLET | ORAL | Status: DC

## 2014-11-08 NOTE — Patient Instructions (Signed)
1.  Start topiramate (Topamax) 25mg  at bedtime.  Possible side effects include: impaired thinking, sedation, paresthesias (numbness and tingling) and weight loss.  It may cause dehydration and there is a small risk for kidney stones, so make sure to stay hydrated with water during the day.  There is also a very small risk for glaucoma, so if you notice any change in your vision while taking this medication, see an ophthalmologist.  There is also a very small risk of possible suicidal ideation, as it the case with all antiepileptic medications.  Pregnancy Guidelines 1. If any medication changes are to be made, they should be completed several months before conception. Lamictal (lamotrigine) and Trileptal (oxcarbazepine) levels, in particular, need to carefully monitored (at least once a month) during pregnancy as drug levels for these two medications tend to decrease by at least 30% during pregnancy. 2. The risk of fetal malformation is increased in women taking seizure medications: However, the risk is lower in Topamax, when compared to older antiepileptic medications.  The risk is approximately 3% compared to 1-2% in the general population. 3. Women with epilepsy planning a pregnancy should take 5 mg/day of folic acid in the preconception period and throughout pregnancy. 4. Vitamin K (20 mg/day) should be used in the last month of pregnancy in women on enzyme-inducing seizure medications (such as Topamax).  Infants should receive 1 mg of vitamin K intramuscularly at birth, to prevent hemorrhagic disease of the newborn. 5. Overbreathing (hyperventilation), sleep deprivation, pain, and emotional stress increase the risk of seizures during labor.  Consider epidural anesthesia early in the labor. 6. All currently available seizure medications can be taken while breast feeding.  2.  We will taper off of propranolol.  Stop the extended release form.  I prescribed immediate release form 60mg .  Take 1/2 tablet in  morning and 1 tablet at bedtime for 7 days, then 1/2 tablet twice daily for 7 days, then 1/2 tablet at bedtime for 7 days, then stop.    3.  For abortive therapy, take Imitrex injection.  If not available, then take Relpax 40mg  at earliest onset of headache.  May repeat once in 2 hours if needed.   4.  Follow up in 3 months.  Call in 4 weeks with update.
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
## 2014-11-13 ENCOUNTER — Other Ambulatory Visit: Payer: Self-pay | Admitting: Neurology

## 2014-11-23 ENCOUNTER — Other Ambulatory Visit: Payer: Self-pay | Admitting: Neurology

## 2014-12-02 ENCOUNTER — Other Ambulatory Visit: Payer: Self-pay | Admitting: Neurology

## 2014-12-04 ENCOUNTER — Telehealth: Payer: Self-pay | Admitting: Family Medicine

## 2014-12-04 ENCOUNTER — Telehealth: Payer: Self-pay | Admitting: *Deleted

## 2014-12-04 MED ORDER — CLONAZEPAM 1 MG PO TABS: 1.0000 mg | ORAL_TABLET | Freq: Two times a day (BID) | ORAL | Status: DC | PRN

## 2014-12-04 NOTE — Telephone Encounter (Signed)
Ok for #60 but pt needs CSC if she doesn't have one and needs UDS

## 2014-12-04 NOTE — Telephone Encounter (Signed)
Caller name: Mauricia AreaHester, Malu Relation to pt: self  Call back number: [phone] Pharmacy:  [id number] [place] - [address] [phone] (Phone)     Reason for call:  Pt requesting a refill clonazePAM (KLONOPIN) 1 MG tablet

## 2014-12-04 NOTE — Telephone Encounter (Signed)
Last OV 07-04-14 Clonazepam last filled 10-07-14 #60 with 0

## 2014-12-04 NOTE — Telephone Encounter (Signed)
Medication working fine not had many headaches this month please send her imatex shot through her insurance sp she can get that soon Call back number [phone]

## 2014-12-04 NOTE — Telephone Encounter (Signed)
Med filled, csc letter printed and pt notified.

## 2014-12-05 ENCOUNTER — Telehealth: Payer: Self-pay | Admitting: *Deleted

## 2014-12-05 NOTE — Telephone Encounter (Signed)
Noted  

## 2014-12-05 NOTE — Telephone Encounter (Signed)
Pt gave UDS today. Pt wanted to let the doctor know that the medication will most likely not show in her urine, pt states that shes trying to see another psychiatrist and change medication.

## 2014-12-07 ENCOUNTER — Other Ambulatory Visit: Payer: Self-pay | Admitting: Neurology

## 2014-12-12 ENCOUNTER — Telehealth: Payer: Self-pay | Admitting: Neurology

## 2014-12-12 NOTE — Telephone Encounter (Signed)
[phone] pt would like to talk to someone about her injections please call

## 2014-12-13 ENCOUNTER — Other Ambulatory Visit: Payer: Self-pay | Admitting: Neurology

## 2014-12-13 NOTE — Telephone Encounter (Signed)
Refill for propranolol denied. Last office note states for patient to stop medication.

## 2014-12-13 NOTE — Telephone Encounter (Signed)
Spoke with patient. She states she needs a prior authorization for imitrex injection. This message was sent last week. She states she has not heard anything. Made her aware [facility], because I don't see anything documented in EPIC, and she will get back in touch with her on Monday.

## 2014-12-15 NOTE — Telephone Encounter (Signed)
Needs imitrex injection 6mg  [place] rx for pharmacy I will fax in am so prior auth can be gotten

## 2015-01-01 ENCOUNTER — Other Ambulatory Visit: Payer: Self-pay | Admitting: Neurology

## 2015-01-07 ENCOUNTER — Telehealth: Payer: Self-pay | Admitting: Family Medicine

## 2015-01-07 MED ORDER — CLONAZEPAM 1 MG PO TABS: 1.0000 mg | ORAL_TABLET | Freq: Two times a day (BID) | ORAL | Status: DC | PRN

## 2015-01-07 NOTE — Telephone Encounter (Signed)
Med filled.  

## 2015-01-07 NOTE — Telephone Encounter (Signed)
Please advise, this was not in pt system per UDS last time.   Last OV 08-09-14 Medication last filled 1-6- #60 with 0

## 2015-01-07 NOTE — Telephone Encounter (Signed)
Caller name:Rodino, Tamzin Relation to XB:JYNWpt:self Call back [phone] [facility] [address] [facility] Reason for call: pt is needing rx clonazePAM (KLONOPIN) 1 MG tablet

## 2015-01-07 NOTE — Telephone Encounter (Signed)
Ok for #60, no refills 

## 2015-02-04 ENCOUNTER — Telehealth: Payer: Self-pay | Admitting: Family Medicine

## 2015-02-04 MED ORDER — CLONAZEPAM 1 MG PO TABS: 1.0000 mg | ORAL_TABLET | Freq: Two times a day (BID) | ORAL | Status: DC | PRN

## 2015-02-04 NOTE — Telephone Encounter (Signed)
Ok for #60 but should not taking this regularly

## 2015-02-04 NOTE — Telephone Encounter (Signed)
Caller name: Mauricia AreaHester, Shondrika Relation to pt: self  Call back number: [phone] [id number] [place] - [address] [phone] (Phone) [phone])         Reason for call:  Pt requesting a refill clonazePAM (KLONOPIN) 1 MG tablet

## 2015-02-04 NOTE — Telephone Encounter (Signed)
Last OV 07-04-14 Clonazepam last filled 01-07-15 #60 with 0

## 2015-02-04 NOTE — Telephone Encounter (Signed)
Med filled and faxed.  

## 2015-02-05 ENCOUNTER — Telehealth: Payer: Self-pay | Admitting: Family Medicine

## 2015-02-05 NOTE — Telephone Encounter (Signed)
Signed medical release form received via fax. Last OV note with Dr[facility] Beverely Low[facility] I also advised them that pt also sees Dr. Everlena CooperJaffe. JG//CMA
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
## 2015-02-07 ENCOUNTER — Ambulatory Visit: Payer: BC Managed Care – PPO | Admitting: Neurology

## 2015-03-07 ENCOUNTER — Other Ambulatory Visit: Payer: Self-pay | Admitting: Family Medicine

## 2015-03-07 NOTE — Telephone Encounter (Signed)
Last OV 07-04-14 Clonazepam last filled 02/04/15 #60 with 0

## 2015-03-07 NOTE — Telephone Encounter (Signed)
Med filled and faxed.  

## 2015-03-07 NOTE — Telephone Encounter (Signed)
Ok for #30- this is supposed to be as needed, not to be used regularly

## 2015-03-12 ENCOUNTER — Other Ambulatory Visit: Payer: Self-pay | Admitting: Neurology

## 2015-04-09 ENCOUNTER — Telehealth: Payer: Self-pay | Admitting: Family Medicine

## 2015-04-09 NOTE — Telephone Encounter (Signed)
Relation to pt: self  Call back number: [phone]   Reason for call:  Pt requesting a refill clonazePAM (KLONOPIN) 1 MG tablet

## 2015-04-10 MED ORDER — CLONAZEPAM 1 MG PO TABS: ORAL_TABLET | ORAL | Status: DC

## 2015-04-10 NOTE — Telephone Encounter (Signed)
Last OV 07-04-14 Clonazepam last filled 03-07-15 #30 with 0  moderate

## 2015-04-10 NOTE — Telephone Encounter (Signed)
Ok for #30, no refills 

## 2015-04-10 NOTE — Telephone Encounter (Signed)
Med filled and faxed.  

## 2015-05-12 ENCOUNTER — Telehealth: Payer: Self-pay | Admitting: Family Medicine

## 2015-05-12 MED ORDER — CLONAZEPAM 1 MG PO TABS: ORAL_TABLET | ORAL | Status: DC

## 2015-05-12 NOTE — Telephone Encounter (Signed)
Last ov 07-04-14 Clonazepam last filled 04/10/15 #30 with 0  No upcoming appts

## 2015-05-12 NOTE — Telephone Encounter (Signed)
Relation to pt: self  Call back number: [phone] Pharmacy: [id number] [place], [place] - [address] AT [facility] [address] [phone] (Phone) [phone] (Fax)         Reason for call:  Pt requesting a refill clonazePAM (KLONOPIN) 1 MG tablet

## 2015-05-12 NOTE — Telephone Encounter (Signed)
Ok for #30, no refills.  Needs to schedule CPE 

## 2015-05-23 ENCOUNTER — Ambulatory Visit: Payer: Self-pay | Admitting: Neurology

## 2015-06-07 ENCOUNTER — Other Ambulatory Visit: Payer: Self-pay | Admitting: Neurology

## 2015-06-09 ENCOUNTER — Other Ambulatory Visit: Payer: Self-pay | Admitting: Family Medicine

## 2015-06-09 NOTE — Telephone Encounter (Signed)
Last OV 07-04-14 Clonazepam last filled 05-12-15 #30 with 0  Last RX stated no refills without scheduling a CPE, still no upcoming appts.

## 2015-06-11 ENCOUNTER — Ambulatory Visit (INDEPENDENT_AMBULATORY_CARE_PROVIDER_SITE_OTHER): Payer: Medicaid Other | Admitting: Family Medicine

## 2015-06-11 ENCOUNTER — Encounter: Payer: Self-pay | Admitting: Family Medicine

## 2015-06-11 VITALS — BP 124/80 | HR 77 | Temp 98.1°F | Resp 16 | Ht 64.0 in | Wt 148.4 lb

## 2015-06-11 DIAGNOSIS — Z01419 Encounter for gynecological examination (general) (routine) without abnormal findings: Secondary | ICD-10-CM

## 2015-06-11 DIAGNOSIS — Z0000 Encounter for general adult medical examination without abnormal findings: Secondary | ICD-10-CM

## 2015-06-11 MED ORDER — CLONAZEPAM 1 MG PO TABS: ORAL_TABLET | ORAL | Status: DC

## 2015-06-11 NOTE — Progress Notes (Signed)
   Subjective:    Patient ID: [name] AreaAmber Macke, female    DOB: 03/14/1986, 29 y.o.   MRN: [id number]  HPI CPE- has GYN [facility]) but has not been seen recently.  No concerns today.   Review of Systems Patient reports no vision/ hearing changes, adenopathy,fever, weight change,  persistant/recurrent hoarseness , swallowing issues, chest pain, palpitations, edema, persistant/recurrent cough, hemoptysis, dyspnea (rest/exertional/paroxysmal nocturnal), gastrointestinal bleeding (melena, rectal bleeding), abdominal pain, significant heartburn, bowel changes, GU symptoms (dysuria, hematuria, incontinence), Gyn symptoms (abnormal  bleeding, pain),  syncope, focal weakness, memory loss, numbness & tingling, skin/hair/nail changes, abnormal bruising or bleeding, anxiety, or depression.     Objective:   Physical Exam General Appearance:    Alert, cooperative, no distress, appears stated age  Head:    Normocephalic, without obvious abnormality, atraumatic  Eyes:    PERRL, conjunctiva/corneas clear, EOM's intact, fundi    benign, both eyes  Ears:    Normal TM's and external ear canals, both ears  Nose:   Nares normal, septum midline, mucosa normal, no drainage    or sinus tenderness  Throat:   Lips, mucosa, and tongue normal; teeth and gums normal  Neck:   Supple, symmetrical, trachea midline, no adenopathy;    Thyroid: no enlargement/tenderness/nodules  Back:     Symmetric, no curvature, ROM normal, no CVA tenderness  Lungs:     Clear to auscultation bilaterally, respirations unlabored  Chest Wall:    No tenderness or deformity   Heart:    Regular rate and rhythm, S1 and S2 normal, no murmur, rub   or gallop  Breast Exam:    Deferred to GYN  Abdomen:     Soft, non-tender, bowel sounds active all four quadrants,    no masses, no organomegaly  Genitalia:    Deferred to GYN  Rectal:    Extremities:   Extremities normal, atraumatic, no cyanosis or edema  Pulses:   2+ and  symmetric all extremities  Skin:   Skin color, texture, turgor normal, no rashes or lesions  Lymph nodes:   Cervical, supraclavicular, and axillary nodes normal  Neurologic:   CNII-XII intact, normal strength, sensation and reflexes    throughout          Assessment & Plan:

## 2015-06-11 NOTE — Progress Notes (Signed)
Pre visit review using our clinic review tool, if applicable. No additional management support is needed unless otherwise documented below in the visit note. 

## 2015-06-11 NOTE — Patient Instructions (Signed)
Follow up in 1 year or as needed We'll notify you of your lab results and make any changes if needed Keep up the good work!  You look great! Call with any questions or concerns Happy Early Birthday!!! 

## 2015-06-11 NOTE — Assessment & Plan Note (Signed)
Pt's PE WNL.  Due for GYN exam- will [facility].  Check labs.  Anticipatory guidance provided.

## 2015-06-12 ENCOUNTER — Other Ambulatory Visit: Payer: Self-pay | Admitting: General Practice

## 2015-06-12 LAB — LIPID PANEL
CHOL/HDL RATIO: 2
CHOLESTEROL: 162 mg/dL (ref 0–200)
HDL: 66 mg/dL (ref >39.00)
LDL Cholesterol: 67 mg/dL (ref 0–99)
NonHDL: 96
Triglycerides: 144 mg/dL (ref 0.0–149.0)
VLDL: 28.8 mg/dL (ref 0.0–40.0)

## 2015-06-12 LAB — BASIC METABOLIC PANEL
BUN: 11 mg/dL (ref 6–23)
CALCIUM: 9.2 mg/dL (ref 8.4–10.5)
CO2: 25 mEq/L (ref 19–32)
Chloride: 103 mEq/L (ref 96–112)
Creatinine, Ser: 0.56 mg/dL (ref 0.40–1.20)
GFR: 137.01 mL/min (ref >60.00)
Glucose, Bld: 62 mg/dL — ABNORMAL LOW (ref 70–99)
POTASSIUM: 4.1 meq/L (ref 3.5–5.1)
SODIUM: 136 meq/L (ref 135–145)

## 2015-06-12 LAB — CBC WITH DIFFERENTIAL/PLATELET
Basophils Absolute: 0.2 10*3/uL — ABNORMAL HIGH (ref 0.0–0.1)
Basophils Relative: 1.5 % (ref 0.0–3.0)
EOS ABS: 0.5 10*3/uL (ref 0.0–0.7)
EOS PCT: 5.3 % — AB (ref 0.0–5.0)
HEMATOCRIT: 43.5 % (ref 36.0–46.0)
Hemoglobin: 14.5 g/dL (ref 12.0–15.0)
LYMPHS PCT: 33 % (ref 12.0–46.0)
Lymphs Abs: 3.4 10*3/uL (ref 0.7–4.0)
MCHC: 33.2 g/dL (ref 30.0–36.0)
MCV: 86.3 fl (ref 78.0–100.0)
MONO ABS: 0.7 10*3/uL (ref 0.1–1.0)
Monocytes Relative: 6.7 % (ref 3.0–12.0)
NEUTROS ABS: 5.5 10*3/uL (ref 1.4–7.7)
Neutrophils Relative %: 53.5 % (ref 43.0–77.0)
PLATELETS: 335 10*3/uL (ref 150.0–400.0)
RBC: 5.05 Mil/uL (ref 3.87–5.11)
RDW: 14.9 % (ref 11.5–15.5)
WBC: 10.3 10*3/uL (ref 4.0–10.5)

## 2015-06-12 LAB — HEPATIC FUNCTION PANEL
ALT: 18 U/L (ref 0–35)
AST: 15 U/L (ref 0–37)
Albumin: 4.2 g/dL (ref 3.5–5.2)
Alkaline Phosphatase: 37 U/L — ABNORMAL LOW (ref 39–117)
Bilirubin, Direct: 0 mg/dL (ref 0.0–0.3)
TOTAL PROTEIN: 7.3 g/dL (ref 6.0–8.3)
Total Bilirubin: 0.2 mg/dL (ref 0.2–1.2)

## 2015-06-12 LAB — TSH: TSH: 2.21 u[IU]/mL (ref 0.35–4.50)

## 2015-06-12 LAB — VITAMIN D 25 HYDROXY (VIT D DEFICIENCY, FRACTURES): VITD: 20.6 ng/mL — ABNORMAL LOW (ref 30.00–100.00)

## 2015-06-12 MED ORDER — VITAMIN D (ERGOCALCIFEROL) 1.25 MG (50000 UNIT) PO CAPS: 50000.0000 [IU] | ORAL_CAPSULE | ORAL | Status: AC

## 2015-06-25 ENCOUNTER — Telehealth: Payer: Self-pay | Admitting: Family Medicine

## 2015-06-25 NOTE — Telephone Encounter (Signed)
Patient would like MD to write a letter regarding patient enrolling in the service and would like to speak with nurse.

## 2015-06-25 NOTE — Telephone Encounter (Signed)
Please inform Pt that Shanda Bumps (who was in the [employer]) will not be back until Monday, August 1. Will defer to her when she returns. Thank you.

## 2015-06-25 NOTE — Telephone Encounter (Signed)
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 Patient admant about speaking with nurse first.

## 2015-06-25 NOTE — Telephone Encounter (Signed)
Patient verbalized that she's enrolling in the military and one of the requirements is that she provides a written letter from her physician, stating that she's physically fit for [employer] duty. Per the patient, she's had shoulder surgery in the past, but no current issues. She's been informed that this is one of the medical conditions  that they are needing to know has been resolved.The letter will need to be addressed to [facility] CMO. Informed patient that she will be contacted by the provider or her medical assistant on next week regarding the letter. Patient did not have any further questions or concerns prior to the end of call.

## 2015-06-27 ENCOUNTER — Ambulatory Visit: Payer: Medicaid Other | Admitting: Obstetrics & Gynecology

## 2015-06-30 NOTE — Telephone Encounter (Signed)
Please advise 

## 2015-07-03 ENCOUNTER — Encounter: Payer: Self-pay | Admitting: Family Medicine

## 2015-07-03 NOTE — Telephone Encounter (Signed)
Called pt and informed that letter was placed at front desk for pick up.

## 2015-07-03 NOTE — Telephone Encounter (Signed)
Letter written and printed.

## 2015-07-14 ENCOUNTER — Other Ambulatory Visit: Payer: Self-pay | Admitting: Family Medicine

## 2015-07-14 NOTE — Telephone Encounter (Signed)
Medication filled to pharmacy as requested.   

## 2015-07-14 NOTE — Telephone Encounter (Signed)
Last OV 06/11/15 Clonazepam last filled 06/11/15 #30 with 0

## 2015-08-13 ENCOUNTER — Telehealth: Payer: Self-pay | Admitting: Family Medicine

## 2015-08-13 NOTE — Telephone Encounter (Signed)
Pt called in stating that the pharmacy is faxing over a script for her KLONOPIN, she is checking the status.   Please call back : [phone]

## 2015-08-13 NOTE — Telephone Encounter (Signed)
noted 

## 2015-08-13 NOTE — Telephone Encounter (Signed)
It pt is in ER, they can provide all meds that she needs

## 2015-08-13 NOTE — Telephone Encounter (Signed)
Last OV 06/11/15 Clonazepam last filled 07/14/15 #30 with 0  

## 2015-08-13 NOTE — Telephone Encounter (Signed)
Called and informed pt, she advised that her levels were at 14 today. The providers from the ER want her to follow up to keep repeating labwork until her levels reach 0. They declined giving her her clonazepam also until her levels dropped more. i advised that we were in the same predicament. That until levels drop or we receive paperwork informing of the miscarriage we cannot fill a controlled substance. Pt stated an understanding and will make sure the hospital send [place] records.

## 2015-08-13 NOTE — Telephone Encounter (Signed)
Pt states she was in ER today and states she is currently having a miscarriage and would like the Klonopin refilled. Best [phone]

## 2015-10-16 ENCOUNTER — Other Ambulatory Visit: Payer: Self-pay | Admitting: Family Medicine

## 2015-10-16 ENCOUNTER — Telehealth: Payer: Self-pay | Admitting: Family Medicine

## 2015-10-16 MED ORDER — NORGESTIMATE-ETH ESTRADIOL 0.25-35 MG-MCG PO TABS: 1.0000 | ORAL_TABLET | Freq: Every day | ORAL | Status: AC

## 2015-10-16 NOTE — Telephone Encounter (Signed)
Ok to refill birth control- pt should start the Sunday after she starts her period

## 2015-10-16 NOTE — Telephone Encounter (Signed)
Relation to ZO:XWRUpt:self Call back number:[phone] [facility]: [id number] - [place], [place] - [address] [phone]) [phone])         Reason for call:  Patient requesting a refill SPRINTEC 28 0.25-35 MG-MCG tablet

## 2015-10-16 NOTE — Telephone Encounter (Signed)
Spoke with pt who advise that she is not pregnant and that her lab values returned to 0 before October 1st. I advised pt again that we had not received any medical records from her visit to ER in [place] or any of the lab results. Please advise

## 2015-10-16 NOTE — Telephone Encounter (Signed)
Medication filled to pharmacy as requested.   

## 2015-10-17 ENCOUNTER — Telehealth: Payer: Self-pay | Admitting: Family Medicine

## 2015-10-17 MED ORDER — CLONAZEPAM 1 MG PO TABS: ORAL_TABLET | ORAL | Status: DC

## 2015-10-17 NOTE — Telephone Encounter (Signed)
Last OV 06/11/15 Clonazepam last filled 07/14/15 #30 with 0

## 2015-10-17 NOTE — Telephone Encounter (Signed)
Ok for #30 

## 2015-10-17 NOTE — Telephone Encounter (Signed)
Medication filled to pharmacy as requested.   

## 2015-10-17 NOTE — Telephone Encounter (Signed)
Relation to GE:XBMWpt:self Call back number:[phone] Pharmacy: [id number] - [place], [place] - [address] [facility] [address] [phone])         Reason for call:  Patient requesting a refill clonazePAM (KLONOPIN) 1 MG tablet

## 2015-11-14 ENCOUNTER — Other Ambulatory Visit: Payer: Self-pay | Admitting: Family Medicine

## 2015-11-14 MED ORDER — CLONAZEPAM 1 MG PO TABS: ORAL_TABLET | ORAL | Status: DC

## 2015-11-14 NOTE — Telephone Encounter (Signed)
Medication filled to pharmacy as requested.   

## 2015-11-14 NOTE — Telephone Encounter (Signed)
Last OV 06/11/15 Clonazepam last filled 10-17-15 #30 with 0

## 2015-11-14 NOTE — Telephone Encounter (Signed)
Can be reached: [phone] Pharmacy: [id number] - [place], [place] - [address]  Reason for call: Pt called for refill on klonopin, she has 4 left, she takes 1/day

## 2015-11-25 ENCOUNTER — Telehealth: Payer: Self-pay | Admitting: Family Medicine

## 2015-11-25 NOTE — Telephone Encounter (Signed)
Pt never picked up letter that was at the front desk about ability to serve in the [employer] from 08-03-2015. (shredded)

## 2015-12-16 ENCOUNTER — Other Ambulatory Visit: Payer: Self-pay | Admitting: Family Medicine

## 2015-12-16 NOTE — Telephone Encounter (Signed)
Pt called to f/u

## 2015-12-16 NOTE — Telephone Encounter (Signed)
Requesting:  clonazepam Contract  Signed on 12/04/2014 UDS  Moderate--over due for next one Last OV   06/11/2015 Last Refill  #30 with 0 refills on 11/14/2015  Please Advise

## 2015-12-16 NOTE — Telephone Encounter (Signed)
Caller name: Self  Can be reached: [phone] Pharmacy:  [id number] - [place], [place] - [address] [phone] (Phone) [phone] (Fax)         Reason for call: refill on clonazePAM (KLONOPIN) 1 MG tablet [[id number]]

## 2015-12-17 ENCOUNTER — Other Ambulatory Visit: Payer: Self-pay

## 2015-12-17 ENCOUNTER — Other Ambulatory Visit: Payer: Self-pay | Admitting: Family Medicine

## 2015-12-17 MED ORDER — CLONAZEPAM 1 MG PO TABS: ORAL_TABLET | ORAL | Status: DC

## 2015-12-17 NOTE — Telephone Encounter (Signed)
Caller name: Self  Can be reached: [phone] Pharmacy:  [id number] - [place]  - [address] [phone] (Phone) [phone] (Fax)        Reason for call: Patient states that pharmacy said they did not receive her rx for [facility]

## 2015-12-17 NOTE — Telephone Encounter (Signed)
Rx faxed to [facility] per patient request.

## 2016-01-16 ENCOUNTER — Telehealth: Payer: Self-pay | Admitting: Family Medicine

## 2016-01-16 ENCOUNTER — Other Ambulatory Visit: Payer: Self-pay

## 2016-01-16 MED ORDER — CLONAZEPAM 1 MG PO TABS: ORAL_TABLET | ORAL | Status: DC

## 2016-01-16 NOTE — Telephone Encounter (Signed)
Caller name: Self  Can be reached: [phone] Pharmacy:  [id number] - [place], [place] - [address] [phone] (Phone) [phone] (Fax)         Reason for call: Refill on clonazePAM (KLONOPIN) 1 MG tablet [[id number]

## 2016-01-16 NOTE — Telephone Encounter (Signed)
Ok for #30 

## 2016-01-16 NOTE — Telephone Encounter (Signed)
Rx faxed to pharmacy  

## 2016-02-11 ENCOUNTER — Telehealth: Payer: Self-pay | Admitting: Family Medicine

## 2016-02-11 MED ORDER — CLONAZEPAM 1 MG PO TABS: ORAL_TABLET | ORAL | Status: AC

## 2016-02-11 NOTE — Telephone Encounter (Signed)
Last OV 06/11/15 Clonazepam last filled 01/16/16 #30 with 0

## 2016-02-11 NOTE — Telephone Encounter (Signed)
Medication filled to pharmacy as requested.   

## 2016-02-11 NOTE — Telephone Encounter (Signed)
Relation to pt:self °Call back number:[phone] °Pharmacy: °[id number] - [place], [place] - [address] [phone] (Phone) [phone] (Fax)  °  °  ° ° ° °Reason for call:  °Patient requesting a refill clonazePAM (KLONOPIN) 1 MG tablet  °

## 2016-02-11 NOTE — Telephone Encounter (Signed)
Ok for #30 

## 2016-03-04 ENCOUNTER — Telehealth: Payer: Self-pay | Admitting: Family Medicine

## 2016-03-04 NOTE — Telephone Encounter (Signed)
Yes, pt needs an appt.

## 2016-03-04 NOTE — Telephone Encounter (Signed)
Caller name:Linnette Relationship to patient: Can be [phone] Pharmacy:[facility] [address] [place]  Reason for call:she went to the emergency room at [facility] [place] and they gave her prednisone 20 mg and tussinex and they said to take allegra d and flonase.  She is no better.  She feells like she has a fever.  Do you want to see her.

## 2016-03-05 ENCOUNTER — Telehealth: Payer: Self-pay | Admitting: Family Medicine

## 2016-03-05 ENCOUNTER — Ambulatory Visit: Payer: Medicaid Other | Admitting: Family Medicine

## 2016-03-08 NOTE — Telephone Encounter (Signed)
Pt was no show 03/05/16 11:30am, acute appt, pt has not rescheduled, 1st no show, charge or no charge?

## 2016-03-09 NOTE — Telephone Encounter (Signed)
No charge. 

## 2016-06-14 ENCOUNTER — Encounter: Payer: Medicaid Other | Admitting: Family Medicine

## 2016-06-14 DIAGNOSIS — Z0289 Encounter for other administrative examinations: Secondary | ICD-10-CM

## 2016-06-16 ENCOUNTER — Telehealth: Payer: Self-pay | Admitting: Family Medicine

## 2016-06-16 NOTE — Telephone Encounter (Signed)
Patient was No Show July 17. Charge or No Charge

## 2016-06-16 NOTE — Telephone Encounter (Signed)
Yes- pt needs to be charged 

## 2016-06-17 ENCOUNTER — Encounter: Payer: Self-pay | Admitting: Family Medicine

## 2018-04-18 ENCOUNTER — Encounter: Payer: Self-pay | Admitting: General Practice
